# Patient Record
Sex: Male | Born: 1985
Health system: Southern US, Community
[De-identification: ages and names within clinical notes are randomized; demographics above are authoritative.]

## PROBLEM LIST (undated history)

## (undated) DIAGNOSIS — I2699 Other pulmonary embolism without acute cor pulmonale: Secondary | ICD-10-CM

## (undated) DIAGNOSIS — I639 Cerebral infarction, unspecified: Secondary | ICD-10-CM

## (undated) DIAGNOSIS — I8290 Acute embolism and thrombosis of unspecified vein: Secondary | ICD-10-CM

## (undated) DIAGNOSIS — F445 Conversion disorder with seizures or convulsions: Secondary | ICD-10-CM

## (undated) DIAGNOSIS — I10 Essential (primary) hypertension: Secondary | ICD-10-CM

## (undated) DIAGNOSIS — R569 Unspecified convulsions: Secondary | ICD-10-CM

## (undated) DIAGNOSIS — W3400XA Accidental discharge from unspecified firearms or gun, initial encounter: Secondary | ICD-10-CM

## (undated) DIAGNOSIS — Y249XXA Unspecified firearm discharge, undetermined intent, initial encounter: Secondary | ICD-10-CM

## (undated) DIAGNOSIS — J45909 Unspecified asthma, uncomplicated: Secondary | ICD-10-CM

## (undated) HISTORY — PX: OTHER SURGICAL HISTORY: SHX169

## (undated) HISTORY — PX: TOTAL HIP ARTHROPLASTY: SHX124

## (undated) HISTORY — PX: BRAIN SURGERY: SHX531

---

## 2006-08-12 ENCOUNTER — Emergency Department (HOSPITAL_COMMUNITY): Admission: EM | Admit: 2006-08-12 | Discharge: 2006-08-12 | Payer: Self-pay | Admitting: Emergency Medicine

## 2010-11-28 ENCOUNTER — Emergency Department (HOSPITAL_COMMUNITY)
Admission: EM | Admit: 2010-11-28 | Discharge: 2010-11-29 | Payer: Self-pay | Source: Home / Self Care | Admitting: Emergency Medicine

## 2018-06-14 ENCOUNTER — Other Ambulatory Visit
Admission: RE | Admit: 2018-06-14 | Discharge: 2018-06-14 | Disposition: A | Discharge status: Home / Self Care | Payer: Self-pay | Source: Ambulatory Visit | Attending: *Deleted | Admitting: *Deleted

## 2018-06-14 LAB — CBC WITH DIFFERENTIAL/PLATELET
Basophils Absolute: 0 10*3/uL (ref 0–0.1)
Basophils Relative: 0 %
Eosinophils Absolute: 0.2 10*3/uL (ref 0–0.7)
Eosinophils Relative: 3 %
HCT: 45.9 % (ref 40.0–52.0)
Hemoglobin: 15.3 g/dL (ref 13.0–18.0)
Lymphocytes Relative: 38 %
Lymphs Abs: 2.8 10*3/uL (ref 1.0–3.6)
MCH: 28.9 pg (ref 26.0–34.0)
MCHC: 33.4 g/dL (ref 32.0–36.0)
MCV: 86.4 fL (ref 80.0–100.0)
Monocytes Absolute: 0.4 10*3/uL (ref 0.2–1.0)
Monocytes Relative: 6 %
Neutro Abs: 4 10*3/uL (ref 1.4–6.5)
Neutrophils Relative %: 53 %
Platelets: 266 10*3/uL (ref 150–440)
RBC: 5.31 MIL/uL (ref 4.40–5.90)
RDW: 14 % (ref 11.5–14.5)
WBC: 7.6 10*3/uL (ref 3.8–10.6)

## 2018-06-14 LAB — PROTIME-INR
INR: 1.39
Prothrombin Time: 16.9 seconds — ABNORMAL HIGH (ref 11.4–15.2)

## 2018-06-21 ENCOUNTER — Other Ambulatory Visit
Admission: RE | Admit: 2018-06-21 | Discharge: 2018-06-21 | Disposition: A | Discharge status: Home / Self Care | Payer: Self-pay | Source: Ambulatory Visit | Attending: Internal Medicine | Admitting: Internal Medicine

## 2018-06-21 LAB — PROTIME-INR
INR: 2.54
Prothrombin Time: 27.1 seconds — ABNORMAL HIGH (ref 11.4–15.2)

## 2018-06-21 LAB — APTT: aPTT: 45 seconds — ABNORMAL HIGH (ref 24–36)

## 2018-11-17 ENCOUNTER — Encounter (HOSPITAL_COMMUNITY): Payer: Self-pay

## 2018-11-17 ENCOUNTER — Emergency Department (HOSPITAL_COMMUNITY)
Admission: EM | Admit: 2018-11-17 | Discharge: 2018-11-18 | Disposition: A | Discharge status: Home / Self Care | Payer: Self-pay | Attending: Emergency Medicine | Admitting: Emergency Medicine

## 2018-11-17 ENCOUNTER — Emergency Department (HOSPITAL_COMMUNITY): Payer: Self-pay

## 2018-11-17 DIAGNOSIS — T50901A Poisoning by unspecified drugs, medicaments and biological substances, accidental (unintentional), initial encounter: Secondary | ICD-10-CM | POA: Insufficient documentation

## 2018-11-17 DIAGNOSIS — Z7901 Long term (current) use of anticoagulants: Secondary | ICD-10-CM | POA: Insufficient documentation

## 2018-11-17 DIAGNOSIS — Z79899 Other long term (current) drug therapy: Secondary | ICD-10-CM | POA: Insufficient documentation

## 2018-11-17 DIAGNOSIS — R55 Syncope and collapse: Secondary | ICD-10-CM | POA: Insufficient documentation

## 2018-11-17 LAB — COMPREHENSIVE METABOLIC PANEL
ALT: 37 U/L (ref 0–44)
AST: 23 U/L (ref 15–41)
Albumin: 3.8 g/dL (ref 3.5–5.0)
Alkaline Phosphatase: 85 U/L (ref 38–126)
Anion gap: 13 (ref 5–15)
BUN: 13 mg/dL (ref 6–20)
CO2: 22 mmol/L (ref 22–32)
Calcium: 8.4 mg/dL — ABNORMAL LOW (ref 8.9–10.3)
Chloride: 105 mmol/L (ref 98–111)
Creatinine, Ser: 1.17 mg/dL (ref 0.61–1.24)
GFR calc Af Amer: >60 mL/min (ref >60)
GFR calc non Af Amer: >60 mL/min (ref >60)
Glucose, Bld: 168 mg/dL — ABNORMAL HIGH (ref 70–99)
Potassium: 3.5 mmol/L (ref 3.5–5.1)
Sodium: 140 mmol/L (ref 135–145)
Total Bilirubin: 1 mg/dL (ref 0.3–1.2)
Total Protein: 6.4 g/dL — ABNORMAL LOW (ref 6.5–8.1)

## 2018-11-17 LAB — CBC
HCT: 47.6 % (ref 39.0–52.0)
Hemoglobin: 14.8 g/dL (ref 13.0–17.0)
MCH: 27.4 pg (ref 26.0–34.0)
MCHC: 31.1 g/dL (ref 30.0–36.0)
MCV: 88.1 fL (ref 80.0–100.0)
Platelets: 236 10*3/uL (ref 150–400)
RBC: 5.4 MIL/uL (ref 4.22–5.81)
RDW: 13.2 % (ref 11.5–15.5)
WBC: 11.1 10*3/uL — ABNORMAL HIGH (ref 4.0–10.5)
nRBC: 0 % (ref 0.0–0.2)

## 2018-11-17 LAB — RAPID URINE DRUG SCREEN, HOSP PERFORMED
Amphetamines: NOT DETECTED
Barbiturates: NOT DETECTED
Benzodiazepines: NOT DETECTED
Cocaine: POSITIVE — AB
Opiates: NOT DETECTED
Tetrahydrocannabinol: POSITIVE — AB

## 2018-11-17 LAB — PHENYTOIN LEVEL, TOTAL: Phenytoin Lvl: <2.5 ug/mL — ABNORMAL LOW (ref 10.0–20.0)

## 2018-11-17 LAB — CBG MONITORING, ED: Glucose-Capillary: 149 mg/dL — ABNORMAL HIGH (ref 70–99)

## 2018-11-17 LAB — PROTIME-INR
INR: 1.44
Prothrombin Time: 17.3 seconds — ABNORMAL HIGH (ref 11.4–15.2)

## 2018-11-17 LAB — ETHANOL: Alcohol, Ethyl (B): <10 mg/dL (ref <10)

## 2018-11-17 LAB — ACETAMINOPHEN LEVEL: Acetaminophen (Tylenol), Serum: <10 ug/mL — ABNORMAL LOW (ref 10–30)

## 2018-11-17 LAB — SALICYLATE LEVEL: Salicylate Lvl: <7 mg/dL (ref 2.8–30.0)

## 2018-11-17 MED ORDER — SODIUM CHLORIDE 0.9 % IV BOLUS (SEPSIS)
1000.0000 mL | Freq: Once | INTRAVENOUS | Status: AC
  Administered 2018-11-17: 1000 mL via INTRAVENOUS

## 2018-11-17 MED ORDER — SODIUM CHLORIDE 0.9 % IV SOLN
1000.0000 mL | INTRAVENOUS | Status: DC
Start: 2018-11-17 — End: 2018-11-18
  Administered 2018-11-17: 1000 mL via INTRAVENOUS
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
## 2018-11-17 NOTE — ED Notes (Signed)
Bed: RESA Expected date:  Expected time:  Means of arrival:  Comments: Overdose/narcan
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
## 2018-11-17 NOTE — ED Notes (Signed)
Pt states that he doesn't do heroin ans when asked if he was suicidal, pt responds "fuck no,"  Pt's belongings are on the bedside table

## 2018-11-17 NOTE — ED Notes (Signed)
Pt states that he takes dilantin, keppra and topamax for his seizures and he takes coumadin for his blood clots

## 2018-11-17 NOTE — ED Notes (Signed)
[place] [employer] [place] had [place] in his bag

## 2018-11-18 MED ORDER — LEVETIRACETAM 500 MG PO TABS: 1000.0000 mg | ORAL_TABLET | Freq: Two times a day (BID) | ORAL | 0 refills | Status: DC

## 2018-11-18 NOTE — ED Notes (Signed)
Pt verbalized discharge understanding and follow up care. Gait steady. Headed home with family

## 2018-11-18 NOTE — Discharge Instructions (Signed)
1.  Follow-up tomorrow morning with the present to get your medication doses.  You need to make phone calls first thing tomorrow for follow-up. 2.  You are being given a prescription for Keppra 1000 mg twice a day based on your report of your dose.  You must get established with a family doctor and neurologist as soon as possible.

## 2020-06-06 ENCOUNTER — Emergency Department (HOSPITAL_BASED_OUTPATIENT_CLINIC_OR_DEPARTMENT_OTHER)
Admit: 2020-06-06 | Discharge: 2020-06-06 | Disposition: A | Discharge status: Home / Self Care | Payer: Self-pay | Attending: Emergency Medicine | Admitting: Emergency Medicine

## 2020-06-06 ENCOUNTER — Other Ambulatory Visit: Payer: Self-pay

## 2020-06-06 ENCOUNTER — Emergency Department (HOSPITAL_COMMUNITY)
Admission: EM | Admit: 2020-06-06 | Discharge: 2020-06-06 | Disposition: A | Discharge status: Home / Self Care | Payer: Self-pay | Attending: Emergency Medicine | Admitting: Emergency Medicine

## 2020-06-06 ENCOUNTER — Emergency Department (HOSPITAL_COMMUNITY): Payer: Self-pay

## 2020-06-06 ENCOUNTER — Encounter (HOSPITAL_COMMUNITY): Payer: Self-pay

## 2020-06-06 DIAGNOSIS — Z7982 Long term (current) use of aspirin: Secondary | ICD-10-CM | POA: Insufficient documentation

## 2020-06-06 DIAGNOSIS — Z008 Encounter for other general examination: Secondary | ICD-10-CM

## 2020-06-06 DIAGNOSIS — R2243 Localized swelling, mass and lump, lower limb, bilateral: Secondary | ICD-10-CM | POA: Insufficient documentation

## 2020-06-06 DIAGNOSIS — I825Y3 Chronic embolism and thrombosis of unspecified deep veins of proximal lower extremity, bilateral: Secondary | ICD-10-CM

## 2020-06-06 DIAGNOSIS — Z86718 Personal history of other venous thrombosis and embolism: Secondary | ICD-10-CM

## 2020-06-06 HISTORY — DX: Acute embolism and thrombosis of unspecified vein: I82.90

## 2020-06-06 LAB — CBC WITH DIFFERENTIAL/PLATELET
Abs Immature Granulocytes: 0.03 10*3/uL (ref 0.00–0.07)
Basophils Absolute: 0 10*3/uL (ref 0.0–0.1)
Basophils Relative: 1 %
Eosinophils Absolute: 0.1 10*3/uL (ref 0.0–0.5)
Eosinophils Relative: 2 %
HCT: 44 % (ref 39.0–52.0)
Hemoglobin: 13.8 g/dL (ref 13.0–17.0)
Immature Granulocytes: 0 %
Lymphocytes Relative: 19 %
Lymphs Abs: 1.4 10*3/uL (ref 0.7–4.0)
MCH: 27.5 pg (ref 26.0–34.0)
MCHC: 31.4 g/dL (ref 30.0–36.0)
MCV: 87.6 fL (ref 80.0–100.0)
Monocytes Absolute: 0.6 10*3/uL (ref 0.1–1.0)
Monocytes Relative: 8 %
Neutro Abs: 5.2 10*3/uL (ref 1.7–7.7)
Neutrophils Relative %: 70 %
Platelets: 288 10*3/uL (ref 150–400)
RBC: 5.02 MIL/uL (ref 4.22–5.81)
RDW: 13.4 % (ref 11.5–15.5)
WBC: 7.4 10*3/uL (ref 4.0–10.5)
nRBC: 0 % (ref 0.0–0.2)

## 2020-06-06 LAB — PROTIME-INR
INR: 1.1 (ref 0.8–1.2)
Prothrombin Time: 13.8 seconds (ref 11.4–15.2)

## 2020-06-06 LAB — BASIC METABOLIC PANEL
Anion gap: 9 (ref 5–15)
BUN: 12 mg/dL (ref 6–20)
CO2: 26 mmol/L (ref 22–32)
Calcium: 9.1 mg/dL (ref 8.9–10.3)
Chloride: 105 mmol/L (ref 98–111)
Creatinine, Ser: 1.16 mg/dL (ref 0.61–1.24)
GFR calc Af Amer: >60 mL/min (ref >60)
GFR calc non Af Amer: >60 mL/min (ref >60)
Glucose, Bld: 100 mg/dL — ABNORMAL HIGH (ref 70–99)
Potassium: 4.2 mmol/L (ref 3.5–5.1)
Sodium: 140 mmol/L (ref 135–145)

## 2020-06-06 LAB — D-DIMER, QUANTITATIVE: D-Dimer, Quant: 1.16 ug/mL-FEU — ABNORMAL HIGH (ref 0.00–0.50)

## 2020-06-06 MED ORDER — RIVAROXABAN (XARELTO) VTE STARTER PACK (15 & 20 MG): ORAL_TABLET | ORAL | 0 refills | Status: DC

## 2020-06-06 MED ORDER — RIVAROXABAN 15 MG PO TABS
15.0000 mg | ORAL_TABLET | Freq: Once | ORAL | Status: AC
  Administered 2020-06-06: 15 mg via ORAL
  Filled 2020-06-06: qty 1

## 2020-06-06 MED ORDER — SODIUM CHLORIDE (PF) 0.9 % IJ SOLN
INTRAMUSCULAR | Status: AC
  Filled 2020-06-06: qty 50

## 2020-06-06 MED ORDER — RIVAROXABAN (XARELTO) EDUCATION KIT FOR DVT/PE PATIENTS
PACK | Freq: Once | Status: AC
  Filled 2020-06-06: qty 1

## 2020-06-06 MED ORDER — IOHEXOL 300 MG/ML  SOLN
100.0000 mL | Freq: Once | INTRAMUSCULAR | Status: AC | PRN
  Administered 2020-06-06: 100 mL via INTRAVENOUS

## 2020-06-06 NOTE — ED Provider Notes (Signed)
[facility]-EMERGENCY DEPT Provider Note   CSN: [id number] Arrival date & time: 06/06/20  0047     History Chief Complaint  Patient presents with  . Leg Swelling    John Flynn is a 34 y.o. male.  Patient to ED with [employer]. Per [employer], during arrest the patient reported he was having pain and swelling in bilateral LE's and SOB, and states he has a history of multiple blood clots. 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 He reports he was on Coumadin initially but when seen at [facility] last year it was changed to Xarelto, then back to Coumadin when incarcerated subsequently. He states he hasn't wanted to take Coumadin. He reports his last seizure was 3 days ago.   The history is provided by the patient. No language interpreter was used.       Past Medical History:  Diagnosis Date  . Blood clot in vein     There are no problems to display for this patient.   History reviewed. No pertinent surgical history.     No family history on file.  Social History   Tobacco Use  . Smoking status: Never Smoker  . Smokeless tobacco: Never Used  Substance Use Topics  . Alcohol use: Yes  . Drug use: Yes    Home Medications Prior to Admission medications   Medication Sig Start Date End Date Taking? Authorizing Provider  levETIRAcetam (KEPPRA PO) Take by mouth.    [provider]  levETIRAcetam (KEPPRA) 500 MG tablet Take 2 tablets (1,000 mg total) by mouth 2 (two) times daily. 11/18/18   Arby Barrette, MD  PHENYTOIN PO Take by mouth 3 (three) times daily.     [provider]  Topiramate (TOPAMAX PO) Take by mouth.    [provider]  Warfarin Sodium (COUMADIN PO) Take by mouth.    [provider]    Allergies    Aspirin and Nsaids  Review of Systems   Review of Systems  Constitutional: Negative for chills and fever.  HENT: Negative.    Respiratory: Positive for shortness of breath. Negative for cough.   Cardiovascular: Positive for leg swelling.  Gastrointestinal: Negative.   Musculoskeletal: Negative.   Skin: Negative.   Neurological: Negative.  Negative for seizures and weakness.    Physical Exam Updated Vital Signs BP (!) 139/102 (BP Location: Left Arm)   Pulse 83   Temp 98.6 F (37 C) (Oral)   Resp 18   Ht 5\' 9"  (1.753 m)   Wt 79.4 kg   SpO2 100%   BMI 25.84 kg/m   Physical Exam Vitals and nursing note reviewed.  Constitutional:      Appearance: He is well-developed.  HENT:     Head: Normocephalic.  Cardiovascular:     Rate and Rhythm: Normal rate and regular rhythm.  Pulmonary:     Comments: Poor effort. Patient refuses to breath for exam stating it causes pain. No dyspnea with speaking, able to speak full sentences and move around without visualized respiratory difficulty. Abdominal:     General: Bowel sounds are normal.     Palpations: Abdomen is soft.     Tenderness: There is no abdominal tenderness. There is no guarding or rebound.  Musculoskeletal:        General: Normal range of motion.     Cervical back: Normal range of motion and neck supple.  Comments: There is mild bilateral LE edema mostly to the ankles. No pretibial pitting. No discoloration. Pulses present distally.   Skin:    General: Skin is warm and dry.     Findings: No rash.  Neurological:     Mental Status: He is alert.     ED Results / Procedures / Treatments   Labs (all labs ordered are listed, but only abnormal results are displayed) Labs Reviewed  D-DIMER, QUANTITATIVE (NOT AT [facility])  CBC WITH DIFFERENTIAL/PLATELET  BASIC METABOLIC PANEL  PROTIME-INR   Results for orders placed or performed during the hospital encounter of 06/06/20  D-dimer, quantitative (not at [facility])  Result Value Ref Range   D-Dimer, Quant 1.16 (H) 0.00 - 0.50 ug/mL-FEU  CBC with Differential  Result Value Ref Range   WBC 7.4 4.0 - 10.5  K/uL   RBC 5.02 4.22 - 5.81 MIL/uL   Hemoglobin 13.8 13.0 - 17.0 g/dL   HCT 75.6 39 - 52 %   MCV 87.6 80.0 - 100.0 fL   MCH 27.5 26.0 - 34.0 pg   MCHC 31.4 30.0 - 36.0 g/dL   RDW 43.3 29.5 - 18.8 %   Platelets 288 150 - 400 K/uL   nRBC 0.0 0.0 - 0.2 %   Neutrophils Relative % 70 %   Neutro Abs 5.2 1.7 - 7.7 K/uL   Lymphocytes Relative 19 %   Lymphs Abs 1.4 0.7 - 4.0 K/uL   Monocytes Relative 8 %   Monocytes Absolute 0.6 0 - 1 K/uL   Eosinophils Relative 2 %   Eosinophils Absolute 0.1 0 - 0 K/uL   Basophils Relative 1 %   Basophils Absolute 0.0 0 - 0 K/uL   Immature Granulocytes 0 %   Abs Immature Granulocytes 0.03 0.00 - 0.07 K/uL  Basic metabolic panel  Result Value Ref Range   Sodium 140 135 - 145 mmol/L   Potassium 4.2 3.5 - 5.1 mmol/L   Chloride 105 98 - 111 mmol/L   CO2 26 22 - 32 mmol/L   Glucose, Bld 100 (H) 70 - 99 mg/dL   BUN 12 6 - 20 mg/dL   Creatinine, Ser 4.16 0.61 - 1.24 mg/dL   Calcium 9.1 8.9 - 60.6 mg/dL   GFR calc non Af Amer >60 >60 mL/min   GFR calc Af Amer >60 >60 mL/min   Anion gap 9 5 - 15  Protime-INR  Result Value Ref Range   Prothrombin Time 13.8 11.4 - 15.2 seconds   INR 1.1 0.8 - 1.2    EKG None  Radiology No results found. CT Angio Chest PE W and/or Wo Contrast  Result Date: 06/06/2020 CLINICAL DATA:  PE suspected, lower extremity DVT EXAM: CT ANGIOGRAPHY CHEST WITH CONTRAST TECHNIQUE: Multidetector CT imaging of the chest was performed using the standard protocol during bolus administration of intravenous contrast. Multiplanar CT image reconstructions and MIPs were obtained to evaluate the vascular anatomy. CONTRAST:  OMNIPAQUE IOHEXOL 300 MG/ML  SOLN COMPARISON:  Multiple priors, most recently 08/19/2019 FINDINGS: Cardiovascular: Borderline opacification of the pulmonary arteries. No large central filling defects. More distal lobar and segmental pulmonary emboli are difficult to fully exclude particularly in the lower lungs given  extensive respiratory motion. Central pulmonary arteries are normal caliber. The aorta is normal caliber. No visible acute luminal abnormality nor periaortic stranding or hemorrhage. Normal 3 vessel branching of the aortic arch. Small amount of reflux of contrast into the IVC and hepatic veins, similar to priors. Mediastinum/Nodes: No mediastinal  fluid or gas. Normal thyroid gland and thoracic inlet. No acute abnormality of the trachea or esophagus. No worrisome mediastinal, hilar or axillary adenopathy. Lungs/Pleura: Extensive respiratory motion artifact limits evaluation of the lung parenchyma. No visible consolidative opacity, pneumothorax or effusion. No features of edema. No discernible nodules or masses. Upper Abdomen: No acute abnormalities present in the visualized portions of the upper abdomen. Musculoskeletal: Multilevel degenerative changes are present in the imaged portions of the spine. No acute osseous abnormality or suspicious osseous lesion. Paucity of subcutaneous fat. No worrisome chest wall lesions. Review of the MIP images confirms the above findings. IMPRESSION: 1. No large central filling defects. More distal lobar and segmental pulmonary emboli are difficult to fully exclude particularly in the lower lungs given extensive respiratory motion artifact and borderline opacification. 2. No acute intrathoracic process identified. Electronically Signed   By: Kreg Shropshire M.D.   On: 06/06/2020 03:49   DG Chest Portable 1 View  Result Date: 06/06/2020 CLINICAL DATA:  Shortness of breath EXAM: PORTABLE CHEST 1 VIEW COMPARISON:  07/31/2019 FINDINGS: The heart size and mediastinal contours are within normal limits. Both lungs are clear. The visualized skeletal structures are unremarkable. IMPRESSION: No active disease. Electronically Signed   By: Alcide Clever M.D.   On: 06/06/2020 02:13    Procedures Procedures (including critical care time)  Medications Ordered in ED Medications - No data to  display  ED Course  I have reviewed the triage vital signs and the nursing notes.  Pertinent labs & imaging results that were available during my care of the patient were reviewed by me and considered in my medical decision making (see chart for details).    MDM Rules/Calculators/A&P                          Patient to ED with [employer] for complaints as described in the HPI.   The patient appears stable, in NAD. No tachycardia or hypoxia to suggest acute PE. The patient is felt to be an unreliable historian secondary to complaint of symptoms at the time of arrest, vagueness of details and his attributing this to "I don't remember." He also reports getting medical care including care of his DVT/PE at [facility] regional but there is no record of his being seen at that facility or at [facility] campuses in [place]. Registration was asked to verify the information in his chart. He does list Coumadin on his medications list but he states he is not taking this. INR negative to corroborate this.   D-dimer was done and is elevated to 1.19. CTA PE study shows no major vessel emboli but is a poor study due to motion artifact. Dopplers are felt to be necessary to verify patient medical status. He has been given a Xarelto in the ED and, if/when discharged, will need to continue this due to his stating he has "multiple clots" in the past.   Patient care signed out to [facility], PA-C, pending doppler studies and potential discharge into custody.   Final Clinical Impression(s) / ED Diagnoses Final diagnoses:  None   1. Mild LE edema  Rx / DC Orders ED Discharge Orders    None       Elpidio Anis, PA-C 06/06/20 0719    Ward, Layla Maw, DO 06/06/20 [phone]

## 2020-06-06 NOTE — ED Triage Notes (Signed)
Pt sts bilateral upper and lower extremity swelling. 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 Came in with [employer] for medical clearance prior to going to jail.

## 2020-06-06 NOTE — ED Provider Notes (Signed)
I assumed care of patient from John Ripple PA-C at shift change, please see her note for full H&P   Physical Exam  BP 126/86 (BP Location: Left Arm)   Pulse 85   Temp 98.6 F (37 C) (Oral)   Resp 16   Ht 5\' 9"  (1.753 m)   Wt 79.4 kg   SpO2 100%   BMI 25.84 kg/m   Physical Exam Vitals and nursing note reviewed.  Constitutional:      General: He is not in acute distress. Cardiovascular:     Rate and Rhythm: Normal rate.  Neurological:     Mental Status: He is alert.     ED Course/Procedures     Procedures  MDM   CT Angio Chest PE W and/or Wo Contrast  Result Date: 06/06/2020 CLINICAL DATA:  PE suspected, lower extremity DVT EXAM: CT ANGIOGRAPHY CHEST WITH CONTRAST TECHNIQUE: Multidetector CT imaging of the chest was performed using the standard protocol during bolus administration of intravenous contrast. Multiplanar CT image reconstructions and MIPs were obtained to evaluate the vascular anatomy. CONTRAST:  08/07/2020 OMNIPAQUE IOHEXOL 300 MG/ML  SOLN COMPARISON:  Multiple priors, most recently 08/19/2019 FINDINGS: Cardiovascular: Borderline opacification of the pulmonary arteries. No large central filling defects. More distal lobar and segmental pulmonary emboli are difficult to fully exclude particularly in the lower lungs given extensive respiratory motion. Central pulmonary arteries are normal caliber. The aorta is normal caliber. No visible acute luminal abnormality nor periaortic stranding or hemorrhage. Normal 3 vessel branching of the aortic arch. Small amount of reflux of contrast into the IVC and hepatic veins, similar to priors. Mediastinum/Nodes: No mediastinal fluid or gas. Normal thyroid gland and thoracic inlet. No acute abnormality of the trachea or esophagus. No worrisome mediastinal, hilar or axillary adenopathy. Lungs/Pleura: Extensive respiratory motion artifact limits evaluation of the lung parenchyma. No visible consolidative opacity, pneumothorax or effusion. No  features of edema. No discernible nodules or masses. Upper Abdomen: No acute abnormalities present in the visualized portions of the upper abdomen. Musculoskeletal: Multilevel degenerative changes are present in the imaged portions of the spine. No acute osseous abnormality or suspicious osseous lesion. Paucity of subcutaneous fat. No worrisome chest wall lesions. Review of the MIP images confirms the above findings. IMPRESSION: 1. No large central filling defects. More distal lobar and segmental pulmonary emboli are difficult to fully exclude particularly in the lower lungs given extensive respiratory motion artifact and borderline opacification. 2. No acute intrathoracic process identified. Electronically Signed   By: 08/21/2019 M.D.   On: 06/06/2020 03:49   DG Chest Portable 1 View  Result Date: 06/06/2020 CLINICAL DATA:  Shortness of breath EXAM: PORTABLE CHEST 1 VIEW COMPARISON:  07/31/2019 FINDINGS: The heart size and mediastinal contours are within normal limits. Both lungs are clear. The visualized skeletal structures are unremarkable. IMPRESSION: No active disease. Electronically Signed   By: 08/02/2019 M.D.   On: 06/06/2020 02:13   VAS 08/07/2020 LOWER EXTREMITY VENOUS (DVT) (ONLY [facility] & [facility])  Result Date: 06/06/2020  Lower Venous DVTStudy Indications: Remote history of DVT.  Comparison Study: No prior study Performing Technologist: 08/07/2020 MHA, RDMS, RVT, RDCS  Examination Guidelines: A complete evaluation includes B-mode imaging, spectral Doppler, color Doppler, and power Doppler as needed of all accessible portions of each vessel. Bilateral testing is considered an integral part of a complete examination. Limited examinations for reoccurring indications may be performed as noted. The reflux portion of the exam is performed with the patient in reverse  Trendelenburg.  +---------+---------------+---------+-----------+----------+-----------------+ RIGHT     CompressibilityPhasicitySpontaneityPropertiesThrombus Aging    +---------+---------------+---------+-----------+----------+-----------------+ CFV      Partial        No       Yes                  Chronic           +---------+---------------+---------+-----------+----------+-----------------+ SFJ      Partial                                      Chronic           +---------+---------------+---------+-----------+----------+-----------------+ FV Prox  Partial                                      Chronic           +---------+---------------+---------+-----------+----------+-----------------+ FV Mid   Partial                                      Chronic           +---------+---------------+---------+-----------+----------+-----------------+ FV DistalPartial                                      Chronic           +---------+---------------+---------+-----------+----------+-----------------+ PFV      Partial                                      Chronic           +---------+---------------+---------+-----------+----------+-----------------+ POP      Partial        Yes      Yes                  Age Indeterminate +---------+---------------+---------+-----------+----------+-----------------+ PTV      Full                    Yes                                    +---------+---------------+---------+-----------+----------+-----------------+ PERO     Full                    Yes                                    +---------+---------------+---------+-----------+----------+-----------------+   +---------+---------------+---------+-----------+----------+--------------+ LEFT     CompressibilityPhasicitySpontaneityPropertiesThrombus Aging +---------+---------------+---------+-----------+----------+--------------+ CFV      Full           No       Yes                                  +---------+---------------+---------+-----------+----------+--------------+ SFJ      Full  Chronic        +---------+---------------+---------+-----------+----------+--------------+ FV Prox  Full                                                        +---------+---------------+---------+-----------+----------+--------------+ FV Mid   Full                                                        +---------+---------------+---------+-----------+----------+--------------+ FV DistalFull                                                        +---------+---------------+---------+-----------+----------+--------------+ PFV      Full                                                        +---------+---------------+---------+-----------+----------+--------------+ POP      Partial        Yes      Yes                  Chronic        +---------+---------------+---------+-----------+----------+--------------+ PTV      Full                    Yes                                 +---------+---------------+---------+-----------+----------+--------------+ PERO     Full                    Yes                                 +---------+---------------+---------+-----------+----------+--------------+     Summary: RIGHT: - Findings consistent with age indeterminate deep vein thrombosis involving the right popliteal vein. - Findings consistent with chronic deep vein thrombosis involving the right common femoral vein, SF junction, right femoral vein, and right proximal profunda vein. - No cystic structure found in the popliteal fossa.  LEFT: - Findings consistent with chronic deep vein thrombosis involving the SF junction, and left popliteal vein. - Ultrasound characteristics of enlarged lymph nodes noted in the groin.  *See table(s) above for measurements and observations. Electronically signed by Waverly Ferrarihristopher Dickson MD on 06/06/2020 at 10:25:54 AM.     Final      0845: tech reports diffuse chronic dvt.   Plan is to follow-up on DVT study. Ultrasound shows diffuse chronic DVTs without acute DVT.  His PE study was negative, while there were technical limitations he is not hypoxic, tachycardic or tachypneic.  As he has not been on Xarelto before and had previously been managed on warfarin pharmacy tech came to educate patient.  He is given a prescription for Xarelto.  He is able to eat in the emergency room without difficulty or distress.  He is discharged with the police.  Return precautions were discussed with patient who states their understanding.  At the time of discharge patient denied any unaddressed complaints or concerns.  Patient is agreeable for discharge.  Note: Portions of this report may have been transcribed using voice recognition software. Every effort was made to ensure accuracy; however, inadvertent computerized transcription errors may be present      Cristina Gong, PA-C 06/06/20 1432    Ward, Layla Maw, DO 06/07/20 0011

## 2020-06-06 NOTE — Discharge Instructions (Addendum)
Please take Tylenol (acetaminophen) to relieve your pain.  You may take tylenol, up to 1,000 mg (two extra strength pills).  Do not take more than 3,000 mg tylenol in a 24 hour period.  Please check all medication labels as many medications such as pain and cold medications may contain tylenol. Please do not drink alcohol while taking this medication.  ° °

## 2020-06-06 NOTE — ED Notes (Signed)
Pharmacy called regarding Xarelto education kit, they will be up to educate.

## 2020-06-06 NOTE — Progress Notes (Signed)
Bilateral lower extremity venous duplex completed. Refer to "CV Proc" under chart review to view preliminary results.  Preliminary results discussed with Dr. Elesa Massed.  06/06/2020 9:07 AM Eula Fried., MHA, RVT, RDCS, RDMS

## 2020-08-08 ENCOUNTER — Emergency Department (HOSPITAL_COMMUNITY)
Admission: EM | Admit: 2020-08-08 | Discharge: 2020-08-08 | Disposition: A | Discharge status: Home / Self Care | Attending: Emergency Medicine | Admitting: Emergency Medicine

## 2020-08-08 ENCOUNTER — Emergency Department (HOSPITAL_COMMUNITY)

## 2020-08-08 ENCOUNTER — Encounter (HOSPITAL_COMMUNITY): Payer: Self-pay | Admitting: Obstetrics and Gynecology

## 2020-08-08 ENCOUNTER — Other Ambulatory Visit: Payer: Self-pay

## 2020-08-08 DIAGNOSIS — G40909 Epilepsy, unspecified, not intractable, without status epilepticus: Secondary | ICD-10-CM | POA: Insufficient documentation

## 2020-08-08 DIAGNOSIS — Z86718 Personal history of other venous thrombosis and embolism: Secondary | ICD-10-CM | POA: Diagnosis not present

## 2020-08-08 DIAGNOSIS — Z9114 Patient's other noncompliance with medication regimen: Secondary | ICD-10-CM | POA: Insufficient documentation

## 2020-08-08 DIAGNOSIS — Z79899 Other long term (current) drug therapy: Secondary | ICD-10-CM | POA: Diagnosis not present

## 2020-08-08 DIAGNOSIS — Z7901 Long term (current) use of anticoagulants: Secondary | ICD-10-CM | POA: Insufficient documentation

## 2020-08-08 LAB — CBC
HCT: 41.7 % (ref 39.0–52.0)
Hemoglobin: 13 g/dL (ref 13.0–17.0)
MCH: 28 pg (ref 26.0–34.0)
MCHC: 31.2 g/dL (ref 30.0–36.0)
MCV: 89.7 fL (ref 80.0–100.0)
Platelets: 200 10*3/uL (ref 150–400)
RBC: 4.65 MIL/uL (ref 4.22–5.81)
RDW: 14 % (ref 11.5–15.5)
WBC: 5.2 10*3/uL (ref 4.0–10.5)
nRBC: 0 % (ref 0.0–0.2)

## 2020-08-08 LAB — BASIC METABOLIC PANEL
Anion gap: 10 (ref 5–15)
BUN: 9 mg/dL (ref 6–20)
CO2: 24 mmol/L (ref 22–32)
Calcium: 7.8 mg/dL — ABNORMAL LOW (ref 8.9–10.3)
Chloride: 107 mmol/L (ref 98–111)
Creatinine, Ser: 0.89 mg/dL (ref 0.61–1.24)
GFR calc Af Amer: >60 mL/min (ref >60)
GFR calc non Af Amer: >60 mL/min (ref >60)
Glucose, Bld: 106 mg/dL — ABNORMAL HIGH (ref 70–99)
Potassium: 3.6 mmol/L (ref 3.5–5.1)
Sodium: 141 mmol/L (ref 135–145)

## 2020-08-08 MED ORDER — PHENYTOIN SODIUM EXTENDED 100 MG PO CAPS: 100.0000 mg | ORAL_CAPSULE | Freq: Three times a day (TID) | ORAL | 0 refills | Status: DC

## 2020-08-08 MED ORDER — LEVETIRACETAM IN NACL 1000 MG/100ML IV SOLN
1000.0000 mg | Freq: Once | INTRAVENOUS | Status: AC
  Administered 2020-08-08: 1000 mg via INTRAVENOUS
  Filled 2020-08-08: qty 100

## 2020-08-08 MED ORDER — PHENYTOIN SODIUM EXTENDED 100 MG PO CAPS
400.0000 mg | ORAL_CAPSULE | Freq: Once | ORAL | Status: AC
  Administered 2020-08-08: 400 mg via ORAL
  Filled 2020-08-08: qty 4

## 2020-08-08 MED ORDER — LEVETIRACETAM 500 MG PO TABS: 1000.0000 mg | ORAL_TABLET | Freq: Two times a day (BID) | ORAL | 0 refills | Status: DC
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
## 2020-08-08 NOTE — Discharge Instructions (Signed)
Take the medications as prescribed for your seizures.  Follow-up with the neurologist when you are able

## 2020-08-08 NOTE — ED Triage Notes (Signed)
Patient reports to the ER from jail for a seizure. Patient was on the floor drooling when they got there and had another seizure with EMS. Patient was given 5mg  of versed by EMS and is now alert and oriented but reports blurred vision.

## 2020-08-16 ENCOUNTER — Emergency Department (HOSPITAL_COMMUNITY)
Admission: EM | Admit: 2020-08-16 | Discharge: 2020-08-16 | Disposition: A | Discharge status: Home / Self Care | Attending: Emergency Medicine | Admitting: Emergency Medicine

## 2020-08-16 ENCOUNTER — Other Ambulatory Visit: Payer: Self-pay

## 2020-08-16 ENCOUNTER — Emergency Department (HOSPITAL_COMMUNITY)

## 2020-08-16 ENCOUNTER — Encounter (HOSPITAL_COMMUNITY): Payer: Self-pay | Admitting: *Deleted

## 2020-08-16 DIAGNOSIS — Z20822 Contact with and (suspected) exposure to covid-19: Secondary | ICD-10-CM | POA: Insufficient documentation

## 2020-08-16 DIAGNOSIS — Z79899 Other long term (current) drug therapy: Secondary | ICD-10-CM | POA: Insufficient documentation

## 2020-08-16 DIAGNOSIS — R569 Unspecified convulsions: Secondary | ICD-10-CM | POA: Insufficient documentation

## 2020-08-16 HISTORY — DX: Unspecified convulsions: R56.9

## 2020-08-16 LAB — CBC
HCT: 42.6 % (ref 39.0–52.0)
Hemoglobin: 13.5 g/dL (ref 13.0–17.0)
MCH: 27.9 pg (ref 26.0–34.0)
MCHC: 31.7 g/dL (ref 30.0–36.0)
MCV: 88 fL (ref 80.0–100.0)
Platelets: 283 10*3/uL (ref 150–400)
RBC: 4.84 MIL/uL (ref 4.22–5.81)
RDW: 13.9 % (ref 11.5–15.5)
WBC: 5.9 10*3/uL (ref 4.0–10.5)
nRBC: 0 % (ref 0.0–0.2)

## 2020-08-16 LAB — BASIC METABOLIC PANEL
Anion gap: 10 (ref 5–15)
BUN: 12 mg/dL (ref 6–20)
CO2: 26 mmol/L (ref 22–32)
Calcium: 9.1 mg/dL (ref 8.9–10.3)
Chloride: 104 mmol/L (ref 98–111)
Creatinine, Ser: 0.92 mg/dL (ref 0.61–1.24)
GFR calc Af Amer: >60 mL/min (ref >60)
GFR calc non Af Amer: >60 mL/min (ref >60)
Glucose, Bld: 89 mg/dL (ref 70–99)
Potassium: 4 mmol/L (ref 3.5–5.1)
Sodium: 140 mmol/L (ref 135–145)

## 2020-08-16 LAB — CBG MONITORING, ED: Glucose-Capillary: 98 mg/dL (ref 70–99)

## 2020-08-16 LAB — SARS CORONAVIRUS 2 BY RT PCR (HOSPITAL ORDER, PERFORMED IN ~~LOC~~ HOSPITAL LAB): SARS Coronavirus 2: NEGATIVE

## 2020-08-16 NOTE — ED Notes (Signed)
Patient transported to CT 

## 2020-08-16 NOTE — ED Triage Notes (Signed)
Pt had one unwitnessed and one witnessed seizure PTA.  Pt arrives by ems from [employer] jail accompanied by Technical sales engineer.  Pt has hx of seizure and per officer he has been taking all meds.  Pt states that he had a coughing attack and felt that he could not breathe. Pt was found in cell down with inhaler. Pt had another seizure with EMS and was given versed pta.  Pt arrives and is alert and oriented. pt states that he had a cell mate that had covid.  Pt states that he has lost his sense of taste and smell. Pt is unvaccinated. Pt also notes that he has been coughing up blood for the past month.

## 2020-08-16 NOTE — ED Notes (Addendum)
CBG- 99 

## 2020-08-16 NOTE — ED Notes (Signed)
Pt given sandwich, soda, and applesauce

## 2020-08-16 NOTE — ED Provider Notes (Signed)
[facility]-EMERGENCY DEPT Provider Note   CSN: [id number] Arrival date & time: 08/16/20  0004     History Chief Complaint  Patient presents with  . Seizures    John Flynn is a 34 y.o. male.  Patient presents to the emergency department with a chief complaint of seizure.  He presents in custody.  He reports having 1 unwitnessed and one witnessed seizure.  He has history of seizures.  He has been compliant with his medications.  Patient also reports recent exposure to Covid, and recently had a coughing attack and he felt like he could not breathe.  Patient was given Versed by EMS.  Patient is unvaccinated.  He has had some productive cough with blood-tinged sputum.   The history is provided by the patient. No language interpreter was used.       Past Medical History:  Diagnosis Date  . Blood clot in vein   . Seizures (HCC)    on keppra    There are no problems to display for this patient.   History reviewed. No pertinent surgical history.     No family history on file.  Social History   Tobacco Use  . Smoking status: Never Smoker  . Smokeless tobacco: Never Used  Substance Use Topics  . Alcohol use: Yes  . Drug use: Yes    Home Medications Prior to Admission medications   Medication Sig Start Date End Date Taking? Authorizing Provider  levETIRAcetam (KEPPRA) 500 MG tablet Take 2 tablets (1,000 mg total) by mouth 2 (two) times daily. 08/08/20   Linwood Dibbles, MD  phenytoin (DILANTIN) 100 MG ER capsule Take 1 capsule (100 mg total) by mouth 3 (three) times daily. 08/08/20   Linwood Dibbles, MD  RIVAROXABAN Carlena Hurl) VTE STARTER PACK (15 & 20 MG TABLETS) Follow package directions: Take one 15mg  tablet by mouth twice a day. On day 22, switch to one 20mg  tablet once a day. Take with food. 06/06/20   , PA-C    Allergies    Aspirin and Nsaids  Review of Systems   Review of Systems  Neurological: Positive for seizures.  All other  systems reviewed and are negative.   Physical Exam Updated Vital Signs BP (!) 165/97   Pulse 83   Temp 98.8 F (37.1 C) (Oral)   Resp 13   Wt 81.6 kg   SpO2 100%   BMI 26.58 kg/m   Physical Exam Vitals and nursing note reviewed.  Constitutional:      Appearance: He is well-developed.  HENT:     Head: Normocephalic and atraumatic.  Eyes:     Conjunctiva/sclera: Conjunctivae normal.  Cardiovascular:     Rate and Rhythm: Normal rate and regular rhythm.     Heart sounds: No murmur heard.   Pulmonary:     Effort: Pulmonary effort is normal. No respiratory distress.     Breath sounds: Normal breath sounds.  Abdominal:     Palpations: Abdomen is soft.     Tenderness: There is no abdominal tenderness.  Musculoskeletal:     Cervical back: Neck supple.  Skin:    General: Skin is warm and dry.  Neurological:     Mental Status: He is alert.     ED Results / Procedures / Treatments   Labs (all labs ordered are listed, but only abnormal results are displayed) Labs Reviewed  SARS CORONAVIRUS 2 BY RT PCR (HOSPITAL ORDER, PERFORMED IN [facility])  CBC  BASIC  METABOLIC PANEL  CBG MONITORING, ED    EKG None  Radiology CT Head Wo Contrast  Result Date: 08/16/2020 CLINICAL DATA:  Seizure. EXAM: CT HEAD WITHOUT CONTRAST TECHNIQUE: Contiguous axial images were obtained from the base of the skull through the vertex without intravenous contrast. COMPARISON:  August 08, 2020 FINDINGS: Brain: No evidence of acute infarction, hemorrhage, hydrocephalus, extra-axial collection or mass lesion/mass effect. Vascular: No hyperdense vessel or unexpected calcification. Skull: Normal. Negative for fracture or focal lesion. Sinuses/Orbits: No acute finding. Other: None. IMPRESSION: No acute intracranial pathology. Electronically Signed   By: Aram Candela M.D.   On: 08/16/2020 01:43   CT Cervical Spine Wo Contrast  Result Date: 08/16/2020 CLINICAL DATA:  Seizure. EXAM: CT  CERVICAL SPINE WITHOUT CONTRAST TECHNIQUE: Multidetector CT imaging of the cervical spine was performed without intravenous contrast. Multiplanar CT image reconstructions were also generated. COMPARISON:  None. FINDINGS: Alignment: Normal. Skull base and vertebrae: No acute fracture. No primary bone lesion or focal pathologic process. Soft tissues and spinal canal: No prevertebral fluid or swelling. No visible canal hematoma. Disc levels: Mild to moderate severity endplate sclerosis is seen at the levels of C5-C6 and C6-C7, with very mild endplate sclerosis noted at the level of C4-C5. Mild intervertebral disc space narrowing is seen at the levels of C4-C5 and C5-C6. Moderate severity intervertebral disc space narrowing is seen at the level of C6-C7. Normal bilateral multilevel facet joints are noted. Upper chest: Negative. Other: None. IMPRESSION: 1. No acute osseous abnormality. 2. Mild-to-moderate severity degenerative changes at the levels of C4-C5, C5-C6 and C6-C7. Electronically Signed   By: Aram Candela M.D.   On: 08/16/2020 01:46   DG Chest Portable 1 View  Result Date: 08/16/2020 CLINICAL DATA:  Coughing up blood short of breath EXAM: PORTABLE CHEST 1 VIEW COMPARISON:  06/06/2020 FINDINGS: The heart size and mediastinal contours are within normal limits. Both lungs are clear. The visualized skeletal structures are unremarkable. IMPRESSION: No active disease. Electronically Signed   By: Jasmine Pang M.D.   On: 08/16/2020 00:46    Procedures Procedures (including critical care time)  Medications Ordered in ED Medications - No data to display  ED Course  I have reviewed the triage vital signs and the nursing notes.  Pertinent labs & imaging results that were available during my care of the patient were reviewed by me and considered in my medical decision making (see chart for details).    MDM Rules/Calculators/A&P                          Patient here with seizures.  He may have hit  his head.  Will check head CT.  Head CT and cervical spine negative for acute injury.  Laboratory work-up is reassuring.  No significant abnormality noted.  Covid test is negative.  Patient is compliant with his medications.  I have urged him to remain compliant.  He states that he is about to get out of jail, and he will follow up with a neurologist.  He is eating, drinking, and has no further complaints tonight.  Vital signs are stable.  We will plan for discharge with outpatient follow-up. Final Clinical Impression(s) / ED Diagnoses Final diagnoses:  Seizure [facility])    Rx / DC Orders ED Discharge Orders    None       Roxy Horseman, PA-C 08/16/20 0436    Shon Baton, MD 08/16/20 [phone]

## 2020-08-30 ENCOUNTER — Other Ambulatory Visit: Payer: Self-pay

## 2020-08-30 ENCOUNTER — Emergency Department (HOSPITAL_COMMUNITY)

## 2020-08-30 ENCOUNTER — Emergency Department (HOSPITAL_COMMUNITY)
Admission: EM | Admit: 2020-08-30 | Discharge: 2020-08-30 | Disposition: A | Discharge status: Home / Self Care | Attending: Emergency Medicine | Admitting: Emergency Medicine

## 2020-08-30 DIAGNOSIS — W228XXA Striking against or struck by other objects, initial encounter: Secondary | ICD-10-CM | POA: Diagnosis not present

## 2020-08-30 DIAGNOSIS — G40909 Epilepsy, unspecified, not intractable, without status epilepticus: Secondary | ICD-10-CM | POA: Diagnosis not present

## 2020-08-30 DIAGNOSIS — R519 Headache, unspecified: Secondary | ICD-10-CM | POA: Diagnosis not present

## 2020-08-30 DIAGNOSIS — R569 Unspecified convulsions: Secondary | ICD-10-CM | POA: Diagnosis present

## 2020-08-30 LAB — CBC WITH DIFFERENTIAL/PLATELET
Abs Immature Granulocytes: 0.06 10*3/uL (ref 0.00–0.07)
Basophils Absolute: 0 10*3/uL (ref 0.0–0.1)
Basophils Relative: 1 %
Eosinophils Absolute: 0.1 10*3/uL (ref 0.0–0.5)
Eosinophils Relative: 2 %
HCT: 44.1 % (ref 39.0–52.0)
Hemoglobin: 13.9 g/dL (ref 13.0–17.0)
Immature Granulocytes: 1 %
Lymphocytes Relative: 25 %
Lymphs Abs: 1.7 10*3/uL (ref 0.7–4.0)
MCH: 27.1 pg (ref 26.0–34.0)
MCHC: 31.5 g/dL (ref 30.0–36.0)
MCV: 86 fL (ref 80.0–100.0)
Monocytes Absolute: 0.5 10*3/uL (ref 0.1–1.0)
Monocytes Relative: 7 %
Neutro Abs: 4.4 10*3/uL (ref 1.7–7.7)
Neutrophils Relative %: 64 %
Platelets: 312 10*3/uL (ref 150–400)
RBC: 5.13 MIL/uL (ref 4.22–5.81)
RDW: 13.5 % (ref 11.5–15.5)
WBC: 6.7 10*3/uL (ref 4.0–10.5)
nRBC: 0 % (ref 0.0–0.2)

## 2020-08-30 LAB — BASIC METABOLIC PANEL
Anion gap: 6 (ref 5–15)
BUN: 7 mg/dL (ref 6–20)
CO2: 24 mmol/L (ref 22–32)
Calcium: 9 mg/dL (ref 8.9–10.3)
Chloride: 108 mmol/L (ref 98–111)
Creatinine, Ser: 1.08 mg/dL (ref 0.61–1.24)
GFR calc Af Amer: >60 mL/min (ref >60)
GFR calc non Af Amer: >60 mL/min (ref >60)
Glucose, Bld: 92 mg/dL (ref 70–99)
Potassium: 4.2 mmol/L (ref 3.5–5.1)
Sodium: 138 mmol/L (ref 135–145)

## 2020-08-30 LAB — PHENYTOIN LEVEL, TOTAL: Phenytoin Lvl: 2.8 ug/mL — ABNORMAL LOW (ref 10.0–20.0)

## 2020-08-30 MED ORDER — SODIUM CHLORIDE 0.9 % IV SOLN
10.0000 mg/kg | Freq: Once | INTRAVENOUS | Status: AC
  Administered 2020-08-30: 816 mg via INTRAVENOUS
  Filled 2020-08-30: qty 16.32

## 2020-08-30 MED ORDER — LEVETIRACETAM IN NACL 500 MG/100ML IV SOLN
500.0000 mg | Freq: Once | INTRAVENOUS | Status: AC
  Administered 2020-08-30: 500 mg via INTRAVENOUS
  Filled 2020-08-30: qty 100

## 2020-08-30 MED ORDER — ONDANSETRON 4 MG PO TBDP
4.0000 mg | ORAL_TABLET | Freq: Once | ORAL | Status: AC
  Administered 2020-08-30: 4 mg via ORAL
  Filled 2020-08-30: qty 1

## 2020-08-30 MED ORDER — LORAZEPAM 2 MG/ML IJ SOLN: 1.0000 mg | Freq: Once | INTRAMUSCULAR | Status: AC

## 2020-08-30 MED ORDER — LORAZEPAM 2 MG/ML IJ SOLN
INTRAMUSCULAR | Status: AC
  Administered 2020-08-30: 1 mg via INTRAVENOUS
  Filled 2020-08-30: qty 1
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
## 2020-08-30 NOTE — Discharge Instructions (Signed)
Please ensure you are taking your keppra and dilantin as prescribed. Recommend follow-up with neurology as soon as feasible.

## 2020-08-30 NOTE — ED Notes (Addendum)
RN notified that patient is actively seizing, md notified, verbal order for 1mg  ativan received. Seizure pads already in place, oxygen applied, will continue to monitor.

## 2020-08-30 NOTE — ED Notes (Signed)
Entire chart printed out for patient to take back to the jail.
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
## 2020-08-30 NOTE — ED Notes (Signed)
Patient verbalizes understanding of discharge instructions. Opportunity for questioning and answers were provided. Pt discharged from ED via wheelchair in police custody.

## 2020-09-07 ENCOUNTER — Emergency Department (HOSPITAL_COMMUNITY)

## 2020-09-07 ENCOUNTER — Other Ambulatory Visit: Payer: Self-pay

## 2020-09-07 ENCOUNTER — Emergency Department (HOSPITAL_COMMUNITY)
Admission: EM | Admit: 2020-09-07 | Discharge: 2020-09-07 | Disposition: A | Discharge status: Home / Self Care | Attending: Emergency Medicine | Admitting: Emergency Medicine

## 2020-09-07 DIAGNOSIS — G40909 Epilepsy, unspecified, not intractable, without status epilepticus: Secondary | ICD-10-CM | POA: Insufficient documentation

## 2020-09-07 DIAGNOSIS — F17200 Nicotine dependence, unspecified, uncomplicated: Secondary | ICD-10-CM | POA: Diagnosis not present

## 2020-09-07 DIAGNOSIS — Z20822 Contact with and (suspected) exposure to covid-19: Secondary | ICD-10-CM | POA: Insufficient documentation

## 2020-09-07 DIAGNOSIS — R0602 Shortness of breath: Secondary | ICD-10-CM

## 2020-09-07 DIAGNOSIS — R062 Wheezing: Secondary | ICD-10-CM | POA: Insufficient documentation

## 2020-09-07 DIAGNOSIS — Z7901 Long term (current) use of anticoagulants: Secondary | ICD-10-CM | POA: Diagnosis not present

## 2020-09-07 DIAGNOSIS — Z79899 Other long term (current) drug therapy: Secondary | ICD-10-CM | POA: Diagnosis not present

## 2020-09-07 DIAGNOSIS — E876 Hypokalemia: Secondary | ICD-10-CM

## 2020-09-07 LAB — CBC WITH DIFFERENTIAL/PLATELET
Abs Immature Granulocytes: 0.04 10*3/uL (ref 0.00–0.07)
Basophils Absolute: 0 10*3/uL (ref 0.0–0.1)
Basophils Relative: 1 %
Eosinophils Absolute: 0.1 10*3/uL (ref 0.0–0.5)
Eosinophils Relative: 1 %
HCT: 42.2 % (ref 39.0–52.0)
Hemoglobin: 12.9 g/dL — ABNORMAL LOW (ref 13.0–17.0)
Immature Granulocytes: 1 %
Lymphocytes Relative: 31 %
Lymphs Abs: 1.9 10*3/uL (ref 0.7–4.0)
MCH: 26.9 pg (ref 26.0–34.0)
MCHC: 30.6 g/dL (ref 30.0–36.0)
MCV: 87.9 fL (ref 80.0–100.0)
Monocytes Absolute: 0.4 10*3/uL (ref 0.1–1.0)
Monocytes Relative: 6 %
Neutro Abs: 3.7 10*3/uL (ref 1.7–7.7)
Neutrophils Relative %: 60 %
Platelets: 226 10*3/uL (ref 150–400)
RBC: 4.8 MIL/uL (ref 4.22–5.81)
RDW: 13.9 % (ref 11.5–15.5)
WBC: 6.1 10*3/uL (ref 4.0–10.5)
nRBC: 0 % (ref 0.0–0.2)

## 2020-09-07 LAB — COMPREHENSIVE METABOLIC PANEL
ALT: 33 U/L (ref 0–44)
AST: 22 U/L (ref 15–41)
Albumin: 2.5 g/dL — ABNORMAL LOW (ref 3.5–5.0)
Alkaline Phosphatase: 44 U/L (ref 38–126)
Anion gap: 8 (ref 5–15)
BUN: 8 mg/dL (ref 6–20)
CO2: 17 mmol/L — ABNORMAL LOW (ref 22–32)
Calcium: 7.1 mg/dL — ABNORMAL LOW (ref 8.9–10.3)
Chloride: 116 mmol/L — ABNORMAL HIGH (ref 98–111)
Creatinine, Ser: 0.98 mg/dL (ref 0.61–1.24)
GFR calc non Af Amer: >60 mL/min (ref >60)
Glucose, Bld: 74 mg/dL (ref 70–99)
Potassium: 2.9 mmol/L — ABNORMAL LOW (ref 3.5–5.1)
Sodium: 141 mmol/L (ref 135–145)
Total Bilirubin: 0.4 mg/dL (ref 0.3–1.2)
Total Protein: 5.2 g/dL — ABNORMAL LOW (ref 6.5–8.1)

## 2020-09-07 LAB — PHENYTOIN LEVEL, TOTAL: Phenytoin Lvl: 4.9 ug/mL — ABNORMAL LOW (ref 10.0–20.0)

## 2020-09-07 LAB — MAGNESIUM: Magnesium: 1.7 mg/dL (ref 1.7–2.4)

## 2020-09-07 LAB — RESPIRATORY PANEL BY RT PCR (FLU A&B, COVID)
Influenza A by PCR: NEGATIVE
Influenza B by PCR: NEGATIVE
SARS Coronavirus 2 by RT PCR: NEGATIVE

## 2020-09-07 LAB — RAPID URINE DRUG SCREEN, HOSP PERFORMED
Amphetamines: NOT DETECTED
Barbiturates: NOT DETECTED
Benzodiazepines: NOT DETECTED
Cocaine: NOT DETECTED
Opiates: NOT DETECTED
Tetrahydrocannabinol: NOT DETECTED

## 2020-09-07 MED ORDER — MAGNESIUM SULFATE 2 GM/50ML IV SOLN
2.0000 g | Freq: Once | INTRAVENOUS | Status: AC
  Administered 2020-09-07: 2 g via INTRAVENOUS
  Filled 2020-09-07: qty 50

## 2020-09-07 MED ORDER — LEVETIRACETAM 500 MG PO TABS
1000.0000 mg | ORAL_TABLET | Freq: Once | ORAL | Status: AC
  Administered 2020-09-07: 1000 mg via ORAL
  Filled 2020-09-07: qty 2

## 2020-09-07 MED ORDER — ALBUTEROL SULFATE HFA 108 (90 BASE) MCG/ACT IN AERS
2.0000 | INHALATION_SPRAY | Freq: Once | RESPIRATORY_TRACT | Status: AC
  Administered 2020-09-07: 2 via RESPIRATORY_TRACT
  Filled 2020-09-07: qty 6.7

## 2020-09-07 MED ORDER — POTASSIUM CHLORIDE CRYS ER 20 MEQ PO TBCR
40.0000 meq | EXTENDED_RELEASE_TABLET | Freq: Once | ORAL | Status: AC
  Administered 2020-09-07: 40 meq via ORAL
  Filled 2020-09-07: qty 2

## 2020-09-07 MED ORDER — LORAZEPAM 1 MG PO TABS
1.0000 mg | ORAL_TABLET | Freq: Once | ORAL | Status: AC
  Administered 2020-09-07: 1 mg via ORAL
  Filled 2020-09-07: qty 1

## 2020-09-07 MED ORDER — POTASSIUM CHLORIDE CRYS ER 20 MEQ PO TBCR: 20.0000 meq | EXTENDED_RELEASE_TABLET | Freq: Two times a day (BID) | ORAL | 0 refills | Status: DC

## 2020-09-07 MED ORDER — PHENYTOIN SODIUM EXTENDED 30 MG PO CAPS
150.0000 mg | ORAL_CAPSULE | Freq: Every day | ORAL | Status: DC
  Administered 2020-09-07: 150 mg via ORAL
  Filled 2020-09-07 (×2): qty 5

## 2020-09-07 MED ORDER — POTASSIUM CHLORIDE 10 MEQ/100ML IV SOLN
10.0000 meq | Freq: Once | INTRAVENOUS | Status: AC
  Administered 2020-09-07: 10 meq via INTRAVENOUS

## 2020-09-07 MED ORDER — PHENYTOIN SODIUM EXTENDED 100 MG PO CAPS: 100.0000 mg | ORAL_CAPSULE | Freq: Two times a day (BID) | ORAL | 0 refills | Status: DC

## 2020-09-07 MED ORDER — PHENYTOIN SODIUM EXTENDED 30 MG PO CAPS: 150.0000 mg | ORAL_CAPSULE | Freq: Every day | ORAL | 0 refills | Status: DC

## 2020-09-07 MED ORDER — PHENYTOIN SODIUM EXTENDED 100 MG PO CAPS
100.0000 mg | ORAL_CAPSULE | Freq: Two times a day (BID) | ORAL | Status: DC
  Filled 2020-09-07: qty 1
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
## 2020-09-07 NOTE — Progress Notes (Signed)
Phenytoin Initial Consult Indication: hx seizures  Allergies  Allergen Reactions  . Aspirin     Toxic Shock Syndrome   . Nsaids     Toxic Shock Syndrome    Patient Measurements:     There is no height or weight on file to calculate BMI.   Vital signs: Temp: 98.4 F (36.9 C) (10/07 0525) Temp Source: Oral (10/07 0525) BP: 123/79 (10/07 0846) Pulse Rate: 78 (10/07 0846)  Labs: Lab Results  Component Value Date/Time   Albumin 2.5 (L) 09/07/2020 0545   Phenytoin Lvl 4.9 (L) 09/07/2020 0545   Lab Results  Component Value Date   PHENYTOIN 4.9 (L) 09/07/2020   Estimated Creatinine Clearance: 105.2 mL/min (by C-G formula based on SCr of 0.98 mg/dL).   Assessment:  69 YOM presenting from jail with reported seizure activity resolved before arrival in ED, stable in ED, on Keppra 1000 mg BID, phenytoin 100 mg ER PO TID (300mg /day).  Reports compliance with above medications.    Corrected phenytoin level (if needed): 4.9 mcg/ml (total), corrected to 8.2 mcg/ml with current albumin lvl  Goals of care:  Total phenytoin level: 10-20 mcg/ml Free phenytoin level: 1-2 mcg/ml  Plan:  Increase PTA phenytoin regimen by 50mg /day to 100 mg BID + 150 mg once daily for total of 350mg  daily Recommend f/y phenytoin level in 7 days Pharmacy will continue to follow regarding obtaining total phenytoin levels and dose adjustments as indicated.   , PharmD Clinical Pharmacist ED Pharmacist Phone # [phone] 09/07/2020 9:02 AM

## 2020-09-07 NOTE — ED Provider Notes (Signed)
7:59 AM Care transferred to me.  Labs show normal WBC.  His potassium is moderately low at 2.9.  Has low bicarbonate that could be from seizure but also could be from elevated chloride as he has a normal anion gap.  His calcium appears low but corrected is 8.3.  Will give magnesium, potassium and Dilantin.  Pharmacy is recommending an adjustment of his Dilantin as he is only mildly low when corrected for his albumin.  They are recommending 150 mg for 1 dose and then 100 mg to other doses throughout the day.  I will provide a prescription for this.  He will be discharged after his electrolyte and seizure medicine supplementation.   Pricilla Loveless, MD 09/07/20 1245
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
## 2020-09-07 NOTE — ED Notes (Signed)
Patient verbalizes understanding of discharge instructions. Opportunity for questioning and answers were provided. Pt discharged from ED via wheelchair in custody.

## 2020-09-07 NOTE — ED Notes (Signed)
Pt actively wheezing at time of this RN assessment. Pt has active dry cough, reportedly using recuse inhaler for asthma. Pt this assessed wheezing and diminished breath sounds, upon assessment pt coughing and reporting increased pain with inspiration. Pt has 2 Education officer, environmental at bedside.

## 2020-09-07 NOTE — ED Notes (Signed)
Provided pt with bag lunch and sprite zero per crystal- RN

## 2020-09-07 NOTE — Discharge Instructions (Addendum)
We are changing your Dilantin dosing.  You will now take 150 mg once per day and 100 mg 2 times per day for a total of 3 different doses.  You will be given 2 prescriptions.  Otherwise your potassium was abnormal and so you are being prescribed potassium as well.  - According to [place] law, you can not drive unless you are seizure / syncope free for at least 6 months and under physician's care.    - Please maintain precautions. Do not participate in activities where a loss of awareness could harm you or someone else. No swimming alone, no tub bathing, no hot tubs, no driving, no operating motorized vehicles (cars, ATVs, motocycles, etc), lawnmowers, power tools or firearms. No standing at heights, such as rooftops, ladders or stairs. Avoid hot objects such as stoves, heaters, open fires. Wear a helmet when riding a bicycle, scooter, skateboard, etc. and avoid areas of traffic. Set your water heater to 120 degrees or less.

## 2020-10-06 ENCOUNTER — Other Ambulatory Visit (HOSPITAL_COMMUNITY): Payer: Self-pay | Admitting: Emergency Medicine

## 2020-10-06 ENCOUNTER — Other Ambulatory Visit: Payer: Self-pay

## 2020-10-06 ENCOUNTER — Emergency Department (HOSPITAL_COMMUNITY): Payer: Self-pay

## 2020-10-06 ENCOUNTER — Emergency Department (HOSPITAL_COMMUNITY)
Admission: EM | Admit: 2020-10-06 | Discharge: 2020-10-06 | Disposition: A | Discharge status: Home / Self Care | Payer: Self-pay | Attending: Emergency Medicine | Admitting: Emergency Medicine

## 2020-10-06 DIAGNOSIS — Z653 Problems related to other legal circumstances: Secondary | ICD-10-CM | POA: Insufficient documentation

## 2020-10-06 DIAGNOSIS — Z7901 Long term (current) use of anticoagulants: Secondary | ICD-10-CM | POA: Insufficient documentation

## 2020-10-06 DIAGNOSIS — R569 Unspecified convulsions: Secondary | ICD-10-CM | POA: Insufficient documentation

## 2020-10-06 LAB — RAPID URINE DRUG SCREEN, HOSP PERFORMED
Amphetamines: POSITIVE — AB
Barbiturates: NOT DETECTED
Benzodiazepines: POSITIVE — AB
Cocaine: POSITIVE — AB
Opiates: NOT DETECTED
Tetrahydrocannabinol: POSITIVE — AB

## 2020-10-06 LAB — BASIC METABOLIC PANEL
Anion gap: 10 (ref 5–15)
BUN: 9 mg/dL (ref 6–20)
CO2: 24 mmol/L (ref 22–32)
Calcium: 8.5 mg/dL — ABNORMAL LOW (ref 8.9–10.3)
Chloride: 103 mmol/L (ref 98–111)
Creatinine, Ser: 1.1 mg/dL (ref 0.61–1.24)
GFR, Estimated: >60 mL/min (ref >60)
Glucose, Bld: 94 mg/dL (ref 70–99)
Potassium: 4 mmol/L (ref 3.5–5.1)
Sodium: 137 mmol/L (ref 135–145)

## 2020-10-06 LAB — CBC WITH DIFFERENTIAL/PLATELET
Abs Immature Granulocytes: 0.02 10*3/uL (ref 0.00–0.07)
Basophils Absolute: 0 10*3/uL (ref 0.0–0.1)
Basophils Relative: 1 %
Eosinophils Absolute: 0.1 10*3/uL (ref 0.0–0.5)
Eosinophils Relative: 1 %
HCT: 43.1 % (ref 39.0–52.0)
Hemoglobin: 13.4 g/dL (ref 13.0–17.0)
Immature Granulocytes: 0 %
Lymphocytes Relative: 38 %
Lymphs Abs: 2 10*3/uL (ref 0.7–4.0)
MCH: 27.8 pg (ref 26.0–34.0)
MCHC: 31.1 g/dL (ref 30.0–36.0)
MCV: 89.4 fL (ref 80.0–100.0)
Monocytes Absolute: 0.4 10*3/uL (ref 0.1–1.0)
Monocytes Relative: 7 %
Neutro Abs: 2.7 10*3/uL (ref 1.7–7.7)
Neutrophils Relative %: 53 %
Platelets: 273 10*3/uL (ref 150–400)
RBC: 4.82 MIL/uL (ref 4.22–5.81)
RDW: 13.8 % (ref 11.5–15.5)
WBC: 5.1 10*3/uL (ref 4.0–10.5)
nRBC: 0 % (ref 0.0–0.2)

## 2020-10-06 MED ORDER — APIXABAN (ELIQUIS) EDUCATION KIT FOR DVT/PE PATIENTS
PACK | Freq: Once | Status: DC
  Filled 2020-10-06: qty 1

## 2020-10-06 MED ORDER — SODIUM CHLORIDE 0.9 % IV BOLUS (SEPSIS)
1000.0000 mL | Freq: Once | INTRAVENOUS | Status: AC
  Administered 2020-10-06: 1000 mL via INTRAVENOUS

## 2020-10-06 MED ORDER — APIXABAN (ELIQUIS) VTE STARTER PACK (10MG AND 5MG): ORAL_TABLET | ORAL | 0 refills | Status: DC

## 2020-10-06 MED ORDER — LEVETIRACETAM 500 MG PO TABS
1000.0000 mg | ORAL_TABLET | Freq: Once | ORAL | Status: AC
  Administered 2020-10-06: 1000 mg via ORAL
  Filled 2020-10-06: qty 2

## 2020-10-06 MED ORDER — APIXABAN 5 MG PO TABS: 10.0000 mg | ORAL_TABLET | Freq: Two times a day (BID) | ORAL | Status: DC

## 2020-10-06 MED ORDER — PHENYTOIN SODIUM EXTENDED 100 MG PO CAPS
100.0000 mg | ORAL_CAPSULE | Freq: Once | ORAL | Status: AC
  Administered 2020-10-06: 100 mg via ORAL
  Filled 2020-10-06: qty 1

## 2020-10-06 MED ORDER — APIXABAN 5 MG PO TABS
5.0000 mg | ORAL_TABLET | Freq: Two times a day (BID) | ORAL | Status: DC
  Administered 2020-10-06: 5 mg via ORAL
  Filled 2020-10-06: qty 1

## 2020-10-06 MED ORDER — PHENYTOIN SODIUM EXTENDED 100 MG PO CAPS: 100.0000 mg | ORAL_CAPSULE | Freq: Every day | ORAL | 0 refills | Status: DC

## 2020-10-06 MED ORDER — APIXABAN 5 MG PO TABS: 5.0000 mg | ORAL_TABLET | Freq: Two times a day (BID) | ORAL | 0 refills | Status: DC

## 2020-10-06 MED ORDER — SODIUM CHLORIDE 0.9 % IV SOLN
1000.0000 mL | INTRAVENOUS | Status: DC
  Administered 2020-10-06: 1000 mL via INTRAVENOUS

## 2020-10-06 MED ORDER — LEVETIRACETAM 1000 MG PO TABS: 1000.0000 mg | ORAL_TABLET | Freq: Two times a day (BID) | ORAL | 0 refills | Status: DC

## 2020-10-06 MED FILL — levETIRAcetam 1000 MG TABS: 1000 | 30 days supply | Qty: 60 | Fill #0

## 2020-10-06 MED FILL — ELIQUIS 5 MG TABLET: 5 | 30 days supply | Qty: 60 | Fill #0

## 2020-10-06 MED FILL — PHENYTOIN SODIUM EXTENDED 1: 100 | 30 days supply | Qty: 30 | Fill #0

## 2020-10-06 NOTE — ED Notes (Signed)
Lab will collect lavender cbc

## 2020-10-06 NOTE — ED Notes (Signed)
Pharmacy tech bedside

## 2020-10-06 NOTE — Discharge Instructions (Addendum)
We switched you to Eliquis as a different blood thinner for your clots.  You will have a 30 day supply of this.  In the meantime, you should apply for Medicaide or an insurance plan.  Our case managers helped give you a 30 day supply of your medications.  We will not be able to do this again in the ER this year.  It's really important you look into applying for Medicaide or another option to afford your medications.  The Affordable Care Act (Obamacare) insurance marketplace is open now until the end of December if you want to enroll online.  Go to RuleEnforcement.cz  If you cannot afford any blood thinners, I would recommend that you take four baby aspirins per day (324 mg total).  This is NOT an ideal treatment, and is only recommended until you can get back on blood thinners like Eliquis, Coumadin, or Xarelto.
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
## 2020-10-06 NOTE — ED Notes (Signed)
Seizure pads in place
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
## 2020-10-06 NOTE — ED Notes (Signed)
Pt awake and alert, asking for food. Dr. Renaye Rakers aware.

## 2020-10-06 NOTE — ED Notes (Signed)
Lavender re collect
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
## 2020-10-06 NOTE — ED Notes (Signed)
Pt given sandwich, peanut butter, and crackers as requested.
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
## 2020-10-06 NOTE — ED Notes (Signed)
Redraw on lavender CBC and sent to lab

## 2020-10-06 NOTE — ED Notes (Signed)
Patient belongings hat, wallet, lighter, cell phone.

## 2020-10-06 NOTE — ED Notes (Signed)
Per Bonnell Public and Dr. Renaye Rakers, outpatient pharmacy will bring patient 30 day supply of eliquis to bedside and then pt can be discharged.

## 2020-10-06 NOTE — ED Notes (Signed)
An After Visit Summary was printed and given to the patient. Discharge instructions given and no further questions at this time.  Pt A&Ox4, ambulatory at discharge.

## 2020-10-06 NOTE — ED Notes (Signed)
Per Cathlean Cower, CM patient given bus pass.

## 2020-10-06 NOTE — ED Notes (Addendum)
Per Doroteo Glassman, charge RN this RN does not need to call [employer] regarding patients upcoming discharge. No [employer] at patients bedside at this time.

## 2020-10-06 NOTE — ED Notes (Signed)
Christine, Pharm and Dr. Renaye Rakers clarified that pt is receive Eliquis 5mg  PO dose now.

## 2020-10-06 NOTE — ED Notes (Signed)
Cathlean Cower, CM gave this RN patients 30 day supply of meds. Meds have been given to patient. Pt has no further questions regarding meds at this time.

## 2020-10-06 NOTE — ED Notes (Signed)
Peanut butter and crackers given to pt as requested.

## 2020-10-06 NOTE — ED Notes (Signed)
ED Provider at bedside. 

## 2020-10-06 NOTE — ED Notes (Signed)
Lab in room to collect lab

## 2020-10-06 NOTE — ED Notes (Signed)
Called lab, need recollect on lavender. Will perform and send to lab

## 2020-10-09 LAB — PHENYTOIN LEVEL, FREE AND TOTAL
Phenytoin, Free: NOT DETECTED ug/mL (ref 1.0–2.0)
Phenytoin, Total: <0.8 ug/mL — ABNORMAL LOW (ref 10.0–20.0)

## 2020-10-29 ENCOUNTER — Observation Stay (HOSPITAL_COMMUNITY)
Admission: EM | Admit: 2020-10-29 | Discharge: 2020-10-30 | Disposition: A | Discharge status: Home / Self Care | Payer: Self-pay | Attending: Internal Medicine | Admitting: Internal Medicine

## 2020-10-29 ENCOUNTER — Encounter (HOSPITAL_COMMUNITY): Payer: Self-pay

## 2020-10-29 ENCOUNTER — Other Ambulatory Visit: Payer: Self-pay

## 2020-10-29 ENCOUNTER — Emergency Department (HOSPITAL_COMMUNITY): Payer: Self-pay

## 2020-10-29 DIAGNOSIS — N179 Acute kidney failure, unspecified: Secondary | ICD-10-CM | POA: Diagnosis present

## 2020-10-29 DIAGNOSIS — Z20822 Contact with and (suspected) exposure to covid-19: Secondary | ICD-10-CM | POA: Insufficient documentation

## 2020-10-29 DIAGNOSIS — I82409 Acute embolism and thrombosis of unspecified deep veins of unspecified lower extremity: Secondary | ICD-10-CM

## 2020-10-29 DIAGNOSIS — R569 Unspecified convulsions: Principal | ICD-10-CM

## 2020-10-29 LAB — COMPREHENSIVE METABOLIC PANEL
ALT: 11 U/L (ref 0–44)
AST: 21 U/L (ref 15–41)
Albumin: 3.5 g/dL (ref 3.5–5.0)
Alkaline Phosphatase: 57 U/L (ref 38–126)
Anion gap: 9 (ref 5–15)
BUN: 8 mg/dL (ref 6–20)
CO2: 24 mmol/L (ref 22–32)
Calcium: 8.9 mg/dL (ref 8.9–10.3)
Chloride: 106 mmol/L (ref 98–111)
Creatinine, Ser: 1.42 mg/dL — ABNORMAL HIGH (ref 0.61–1.24)
GFR, Estimated: >60 mL/min (ref >60)
Glucose, Bld: 101 mg/dL — ABNORMAL HIGH (ref 70–99)
Potassium: 4.4 mmol/L (ref 3.5–5.1)
Sodium: 139 mmol/L (ref 135–145)
Total Bilirubin: 1.2 mg/dL (ref 0.3–1.2)
Total Protein: 6.5 g/dL (ref 6.5–8.1)

## 2020-10-29 LAB — CBG MONITORING, ED: Glucose-Capillary: 111 mg/dL — ABNORMAL HIGH (ref 70–99)

## 2020-10-29 LAB — RAPID URINE DRUG SCREEN, HOSP PERFORMED
Amphetamines: POSITIVE — AB
Barbiturates: NOT DETECTED
Benzodiazepines: POSITIVE — AB
Cocaine: POSITIVE — AB
Opiates: NOT DETECTED
Tetrahydrocannabinol: POSITIVE — AB

## 2020-10-29 LAB — RESP PANEL BY RT-PCR (FLU A&B, COVID) ARPGX2
Influenza A by PCR: NEGATIVE
Influenza B by PCR: NEGATIVE
SARS Coronavirus 2 by RT PCR: NEGATIVE

## 2020-10-29 LAB — PHENYTOIN LEVEL, TOTAL: Phenytoin Lvl: <2.5 ug/mL — ABNORMAL LOW (ref 10.0–20.0)

## 2020-10-29 LAB — HIV ANTIBODY (ROUTINE TESTING W REFLEX): HIV Screen 4th Generation wRfx: NONREACTIVE

## 2020-10-29 MED ORDER — APIXABAN 5 MG PO TABS
5.0000 mg | ORAL_TABLET | Freq: Two times a day (BID) | ORAL | Status: DC
  Administered 2020-10-29 – 2020-10-30 (×2): 5 mg via ORAL
  Filled 2020-10-29 (×2): qty 1

## 2020-10-29 MED ORDER — ACETAMINOPHEN 650 MG RE SUPP: 650.0000 mg | RECTAL | Status: DC | PRN

## 2020-10-29 MED ORDER — LEVETIRACETAM IN NACL 1000 MG/100ML IV SOLN
1000.0000 mg | Freq: Once | INTRAVENOUS | Status: AC
  Administered 2020-10-29: 1000 mg via INTRAVENOUS
  Filled 2020-10-29: qty 100

## 2020-10-29 MED ORDER — ONDANSETRON HCL 4 MG/2ML IJ SOLN: 4.0000 mg | Freq: Four times a day (QID) | INTRAMUSCULAR | Status: DC | PRN

## 2020-10-29 MED ORDER — SODIUM CHLORIDE 0.9 % IV SOLN: 2000.0000 mg | Freq: Once | INTRAVENOUS | Status: DC

## 2020-10-29 MED ORDER — PHENYTOIN SODIUM EXTENDED 100 MG PO CAPS
100.0000 mg | ORAL_CAPSULE | Freq: Every day | ORAL | Status: DC
  Administered 2020-10-30: 100 mg via ORAL
  Filled 2020-10-29: qty 1

## 2020-10-29 MED ORDER — SODIUM CHLORIDE 0.9 % IV SOLN
75.0000 mL/h | INTRAVENOUS | Status: DC
  Administered 2020-10-29: 75 mL/h via INTRAVENOUS

## 2020-10-29 MED ORDER — POLYETHYLENE GLYCOL 3350 17 G PO PACK: 17.0000 g | PACK | Freq: Every day | ORAL | Status: DC | PRN

## 2020-10-29 MED ORDER — DOCUSATE SODIUM 100 MG PO CAPS
100.0000 mg | ORAL_CAPSULE | Freq: Two times a day (BID) | ORAL | Status: DC
  Filled 2020-10-29 (×2): qty 1

## 2020-10-29 MED ORDER — LEVETIRACETAM 500 MG PO TABS
1000.0000 mg | ORAL_TABLET | Freq: Two times a day (BID) | ORAL | Status: DC
  Administered 2020-10-29 – 2020-10-30 (×2): 1000 mg via ORAL
  Filled 2020-10-29 (×2): qty 2

## 2020-10-29 MED ORDER — ONDANSETRON HCL 4 MG PO TABS: 4.0000 mg | ORAL_TABLET | Freq: Four times a day (QID) | ORAL | Status: DC | PRN

## 2020-10-29 MED ORDER — LORAZEPAM 2 MG/ML IJ SOLN: 1.0000 mg | INTRAMUSCULAR | Status: DC | PRN

## 2020-10-29 MED ORDER — ACETAMINOPHEN 325 MG PO TABS: 650.0000 mg | ORAL_TABLET | ORAL | Status: DC | PRN

## 2020-10-29 NOTE — ED Notes (Signed)
Attempted report x1. 
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
## 2020-10-29 NOTE — ED Notes (Signed)
Patient transported to CT 

## 2020-10-29 NOTE — Consult Note (Signed)
NEUROLOGY CONSULTATION NOTE   Date of service: October 29, 2020 Patient Name: John Flynn MRN:  [id number] DOB:  [birth year] Reason for consult: "Status epilepticus"  History of Present Illness  John Flynn is a 34 y.o. male with PMH significant for DVTs and Seizures who is supposed to be on Keppra and Dilantin who was brought in by police for a seizure. He is non compliant with medications per prior notes due to difficulty with affording medications.  History obtained from [facility] deputy who was present outside the room.  Patient was arrested today, asked for his inhaler, with seeming overall unwell and so was helped down to the ground where he lost consciousness and had convulsions concerning for a seizure.  They called medics and nurse and he was brought into the emergency department.  He was given Versed in the ambulance.  He was noted to have some rigidity of the right side by the ED providers and we were asked to assess this patient.  On my evaluation, patient sits up to touch.  When asked what is going on, he responds with I do not know.  He states that he is very tired and goes right back to sleep.  I did not notice any arm or leg twitching or weakness, his speech was fluent and his comprehension seemed intact.   ROS  Unable to obtain a detailed review of system as patient has difficulty maintaining wakefulness after getting Versed. Past History   Past Medical History:  Diagnosis Date  . Blood clot in vein   . Seizures (HCC)    on keppra   History reviewed. No pertinent surgical history. History reviewed. No pertinent family history. Social History   Socioeconomic History  . Marital status: Married    Spouse name: Not on file  . Number of children: Not on file  . Years of education: Not on file  . Highest education level: Not on file  Occupational History  . Not on file  Tobacco Use  . Smoking status: Never Smoker  . Smokeless tobacco: Never Used  Substance  and Sexual Activity  . Alcohol use: Yes  . Drug use: Yes  . Sexual activity: Not on file  Other Topics Concern  . Not on file  Social History Narrative  . Not on file   Social Determinants of Health   Financial Resource Strain:   . Difficulty of Paying Living Expenses: Not on file  Food Insecurity:   . Worried About Programme researcher, broadcasting/film/video in the Last Year: Not on file  . Ran Out of Food in the Last Year: Not on file  Transportation Needs:   . Lack of Transportation (Medical): Not on file  . Lack of Transportation (Non-Medical): Not on file  Physical Activity:   . Days of Exercise per Week: Not on file  . Minutes of Exercise per Session: Not on file  Stress:   . Feeling of Stress : Not on file  Social Connections:   . Frequency of Communication with Friends and Family: Not on file  . Frequency of Social Gatherings with Friends and Family: Not on file  . Attends Religious Services: Not on file  . Active Member of Clubs or Organizations: Not on file  . Attends Banker Meetings: Not on file  . Marital Status: Not on file   Allergies  Allergen Reactions  . Aspirin     Toxic Shock Syndrome   . Nsaids     Toxic Shock Syndrome  Medications  (Not in a hospital admission)    Vitals   Vitals:   10/29/20 1135 10/29/20 1200  BP: 116/84 (!) 135/109  Pulse: 74 75  Resp: 18 10  Temp: 98.3 F (36.8 C)   TempSrc: Axillary   SpO2: 98% 100%     There is no height or weight on file to calculate BMI.  Physical Exam   General: Laying comfortably in bed; in no acute distress. HENT: Normal oropharynx and mucosa. Normal external appearance of ears and nose. Neck: Supple, no pain or tenderness CV: No JVD. No peripheral edema. Pulmonary: Symmetric Chest rise. Normal respiratory effort. Abdomen: Soft to touch, non-tender. Ext: No cyanosis, edema, or deformity Skin: No rash. Normal palpation of skin.  Musculoskeletal: Normal digits and nails by inspection. No  clubbing.  Neurologic Examination  Mental status/Cognition: Eyes closed, opens eyes to touch.  Tracks my face, sits up in the bed.  Replies with I do not know what is going on and states that he is very tired and goes back to sleep. Speech/language: Fluent, comprehension intact to simple commands.  Cranial nerves:   CN II Pupils equal and reactive to light   CN III,IV,VI EOM intact, no gaze preference or deviation, no nystagmus   CN V    CN VII no asymmetry, no nasolabial fold flattening   CN VIII    CN IX & X    CN XI    CN XII    Motor:  Muscle bulk: normal, tone normal Unable to do a detailed strength exam due to his somnolence after getting Versed.  He is spontaneously moving all extremities and does sit up in the bed.  Reflexes:  Right Left Comments  Pectoralis      Biceps (C5/6) 1 1   Brachioradialis (C5/6) 1 1    Triceps (C6/7) 1 1    Patellar (L3/4) 1 1    Achilles (S1)      Hoffman      Plantar     Jaw jerk    Sensation: Localizes to noxious stimuli in all extremities.  Coordination/Complex Motor:  Unable to assess coordination however no ataxia noted.  Labs   CBC: No results for input(s): WBC, NEUTROABS, HGB, HCT, MCV, PLT in the last 168 hours.  Basic Metabolic Panel:  Lab Results  Component Value Date   NA 137 10/06/2020   K 4.0 10/06/2020   CO2 24 10/06/2020   GLUCOSE 94 10/06/2020   BUN 9 10/06/2020   CREATININE 1.10 10/06/2020   CALCIUM 8.5 (L) 10/06/2020   GFRNONAA >60 10/06/2020   GFRAA >60 08/30/2020   Lipid Panel: No results found for: LDLCALC HgbA1c: No results found for: HGBA1C Urine Drug Screen:     Component Value Date/Time   LABOPIA NONE DETECTED 10/06/2020 0948   COCAINSCRNUR POSITIVE (A) 10/06/2020 0948   LABBENZ POSITIVE (A) 10/06/2020 0948   AMPHETMU POSITIVE (A) 10/06/2020 0948   THCU POSITIVE (A) 10/06/2020 0948   LABBARB NONE DETECTED 10/06/2020 0948    Alcohol Level     Component Value Date/Time   ETH <10 11/17/2018  2057     Results for orders placed during the hospital encounter of 10/06/20  CT Head Wo Contrast IMPRESSION: Normal examination.   Impression   John Flynn is a 34 y.o. male with PMH significant for DVTs and Seizures who is supposed to be on Keppra and Dilantin who was brought in by police for a seizure. The seizure seems to have been a  prolonged one and he now appears post ictal. Recommend Keppra load of 2G IV once and resuming home dose of Keppra and Dilantin.  Recommendations  - Keppra 2G IV once and resume home dose of Keppra and Dilantin - Given prolonged seizure, monitor overnight to ensure no further seizures. If no more seizures, can be discharged with outpatient follow up. - No driving for 6 months, has to be cleared by neurology before he can resume driving. ______________________________________________________________________   Thank you for the opportunity to take part in the care of this patient. If you have any further questions, please contact the neurology consultation attending.  Signed,  Erick Blinks [facility] Pager Number [phone]

## 2020-10-29 NOTE — ED Provider Notes (Signed)
[facility] EMERGENCY DEPARTMENT Provider Note   CSN: [id number] Arrival date & time: 10/29/20  1131     History Chief Complaint  Patient presents with  . Seizures    John Flynn is a 34 y.o. 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 Deputy who witnessed the seizure states patient had convulsions which were concerning for a seizure that lasted roughly 2 minutes. Patient was seated during the event and moved to the floor. No head injury. Patient was postictal following the event. He was given Versed 5mg  following the incident. No urinary incontinence. Chart reviewed. It appears patient has a history of medication noncompliant. Unsure if he has taken his seizure and anticoagulants as prescribed. During my initial evaluation, patient sleeping in bed and unable to answer any questions.   Level 5 caveat secondary to mental status change  History obtained from deputy and past medical records. No interpreter used during encounter.      Past Medical History:  Diagnosis Date  . Blood clot in vein   . Seizures (HCC)    on keppra    There are no problems to display for this patient.   History reviewed. No pertinent surgical history.     History reviewed. No pertinent family history.  Social History   Tobacco Use  . Smoking status: Never Smoker  . Smokeless tobacco: Never Used  Substance Use Topics  . Alcohol use: Yes  . Drug use: Yes    Home Medications Prior to Admission medications   Medication Sig Start Date End Date Taking? Authorizing Provider  apixaban (ELIQUIS) 5 MG TABS tablet Take 1 tablet (5 mg total) by mouth 2 (two) times daily. 10/06/20 11/05/20  14/5/21, MD  levETIRAcetam (KEPPRA) 1000 MG tablet Take 1 tablet (1,000 mg total) by mouth 2 (two) times daily. 10/06/20 11/05/20   14/5/21, MD  levETIRAcetam (KEPPRA) 500 MG tablet Take 2 tablets (1,000 mg total) by mouth 2 (two) times daily. Patient not taking: Reported on 10/06/2020 08/08/20   10/08/20, MD  phenytoin (DILANTIN) 100 MG ER capsule Take 1 capsule (100 mg total) by mouth 2 (two) times daily. Patient not taking: Reported on 10/06/2020 09/07/20   11/07/20, MD  phenytoin (DILANTIN) 100 MG ER capsule Take 1 capsule (100 mg total) by mouth daily. 10/06/20 11/05/20  14/5/21, MD  phenytoin (DILANTIN) 30 MG ER capsule Take 5 capsules (150 mg total) by mouth daily. Patient not taking: Reported on 10/06/2020 09/07/20 10/07/20  13/6/21, MD  RIVAROXABAN Pricilla Loveless) VTE STARTER PACK (15 & 20 MG TABLETS) Follow package directions: Take one 15mg  tablet by mouth twice a day. On day 22, switch to one 20mg  tablet once a day. Take with food. Patient not taking: Reported on 10/06/2020 06/06/20   , PA-C    Allergies    Aspirin and Nsaids  Review of Systems   Review of Systems  Unable to perform ROS: Mental status change    Physical Exam Updated Vital Signs BP (!) 140/105   Pulse 65   Temp 98.3 F (36.8 C)   Resp 12   SpO2 99%   Physical Exam Vitals and nursing note reviewed.  Constitutional:      General: He is not in acute distress.    Comments: Difficult to arouse  HENT:  Head: Normocephalic.  Eyes:     Pupils: Pupils are equal, round, and reactive to light.  Cardiovascular:     Rate and Rhythm: Normal rate and regular rhythm.     Pulses: Normal pulses.     Heart sounds: Normal heart sounds. No murmur heard.  No friction rub. No gallop.   Pulmonary:     Effort: Pulmonary effort is normal.     Breath sounds: Normal breath sounds.     Comments: Respirations equal and unlabored, patient able to speak in full sentences, lungs clear to auscultation bilaterally Abdominal:     General: Abdomen is flat. Bowel sounds are normal. There is no distension.      Palpations: Abdomen is soft.     Tenderness: There is no abdominal tenderness. There is no guarding or rebound.  Musculoskeletal:     Cervical back: Neck supple.     Comments: No lower extremity edema.   Skin:    General: Skin is warm and dry.  Neurological:     General: No focal deficit present.     Comments: Postictal state. Difficult to arouse. Pupils equal and reactive to light.  Psychiatric:        Mood and Affect: Mood normal.        Behavior: Behavior normal.     ED Results / Procedures / Treatments   Labs (all labs ordered are listed, but only abnormal results are displayed) Labs Reviewed  COMPREHENSIVE METABOLIC PANEL - Abnormal; Notable for the following components:      Result Value   Glucose, Bld 101 (*)    Creatinine, Ser 1.42 (*)    All other components within normal limits  CBG MONITORING, ED - Abnormal; Notable for the following components:   Glucose-Capillary 111 (*)    All other components within normal limits  CBC WITH DIFFERENTIAL/PLATELET  RAPID URINE DRUG SCREEN, HOSP PERFORMED  PHENYTOIN LEVEL, TOTAL    EKG None  Radiology CT Head Wo Contrast  Result Date: 10/29/2020 CLINICAL DATA:  Nontraumatic seizure. EXAM: CT HEAD WITHOUT CONTRAST TECHNIQUE: Contiguous axial images were obtained from the base of the skull through the vertex without intravenous contrast. COMPARISON:  October 06, 2020 FINDINGS: Brain: No evidence of acute infarction, hemorrhage, hydrocephalus, extra-axial collection or mass lesion/mass effect. Vascular: No hyperdense vessel or unexpected calcification. Skull: Normal. Negative for fracture or focal lesion. Sinuses/Orbits: No acute finding. Other: None. IMPRESSION: No acute intracranial abnormality. Electronically Signed   By: Ted Mcalpine M.D.   On: 10/29/2020 12:49    Procedures Procedures (including critical care time)  Medications Ordered in ED Medications  levETIRAcetam (KEPPRA) IVPB 1000 mg/100 mL premix (1,000 mg  Intravenous New Bag/Given 10/29/20 1415)  levETIRAcetam (KEPPRA) IVPB 1000 mg/100 mL premix (1,000 mg Intravenous New Bag/Given 10/29/20 1414)    ED Course  I have reviewed the triage vital signs and the nursing notes.  Pertinent labs & imaging results that were available during my care of the patient were reviewed by me and considered in my medical decision making (see chart for details).  Clinical Course as of Oct 30 1427  Wynelle Link Oct 29, 2020  1239 Discussed case with Dr. Derry Lory with neurology due to concerns about status epilepticus who recommends giving 2g Keppra. Ordered placed. Pharmacist called to ensure medication is started STAT   [CA]  1258 Discussed case with Dr. Derry Lory with neurology who notes low suspicion for status, but recommends observation overnight given possible multiple seizures vs. Prolonged seizures. COVID test ordered. Loading  dose of Keppra given    [CA]  1322 Creatinine(!): 1.42 [CA]  1427 Discussed case with Dr. Ophelia Charter who agrees to admit patient for further evaluation   [CA]    Clinical Course User Index [CA] Audelia Acton Raina Mina   MDM Rules/Calculators/A&P                         34 year old male presents to the ED after a witnessed seizure.  Chart reviewed.  History of same.  History of noncompliance with home medications.  Upon arrival, vitals all within normal limits.  Patient appears possibly postictal during my initial evaluation. Patient hard to arouse. Won't answer any questions. Discussed case with Dr. Derry Lory with neurology due to rigidity and possible status epilepticus. See note above. Routine labs obtained to rule out electrolyte abnormalities and infectious etiology. CT head ordered. Loading dose of Keppra given.  CMP significant for elevated creatinine at 1.42 and mild hyperglycemia at 101, but otherwise unremarkable.  CT head personally reviewed which is negative for any acute abnormalities.  EKG personally reviewed which demonstrates  normal sinus rhythm with no signs of acute ischemia.  2:17 PM reassessed patient at bedside.  Patient opens eyes to sound, but still unable to answer any questions.   Discussed case with Dr. Ophelia Charter who agrees to admit patient for further treatment. COVID test pending.   Final Clinical Impression(s) / ED Diagnoses Final diagnoses:  Seizure-like activity [facility])    Rx / DC Orders ED Discharge Orders    None       Jesusita Oka 10/29/20 1432    Margarita Grizzle, MD 10/30/20 1308

## 2020-10-29 NOTE — ED Triage Notes (Addendum)
Pt BIB EMS from jail. Pt was at the jail house he he had a seizure. Seizure lasted 2 mins.Pt was sitting at the time of the seizure. Cops helped him to the floor. Cops states he didn't hit his head.EMS EMS gave midazolam 5mg .

## 2020-10-29 NOTE — ED Notes (Signed)
Pt returned to [facility] from [facility]

## 2020-10-30 LAB — CBC
HCT: 45 % (ref 39.0–52.0)
Hemoglobin: 14.2 g/dL (ref 13.0–17.0)
MCH: 27.5 pg (ref 26.0–34.0)
MCHC: 31.6 g/dL (ref 30.0–36.0)
MCV: 87.2 fL (ref 80.0–100.0)
Platelets: 280 10*3/uL (ref 150–400)
RBC: 5.16 MIL/uL (ref 4.22–5.81)
RDW: 12.9 % (ref 11.5–15.5)
WBC: 5.4 10*3/uL (ref 4.0–10.5)
nRBC: 0 % (ref 0.0–0.2)

## 2020-10-30 LAB — COMPREHENSIVE METABOLIC PANEL
ALT: 13 U/L (ref 0–44)
AST: 15 U/L (ref 15–41)
Albumin: 3.1 g/dL — ABNORMAL LOW (ref 3.5–5.0)
Alkaline Phosphatase: 53 U/L (ref 38–126)
Anion gap: 6 (ref 5–15)
BUN: 9 mg/dL (ref 6–20)
CO2: 25 mmol/L (ref 22–32)
Calcium: 8.5 mg/dL — ABNORMAL LOW (ref 8.9–10.3)
Chloride: 105 mmol/L (ref 98–111)
Creatinine, Ser: 1.23 mg/dL (ref 0.61–1.24)
GFR, Estimated: >60 mL/min (ref >60)
Glucose, Bld: 115 mg/dL — ABNORMAL HIGH (ref 70–99)
Potassium: 3.6 mmol/L (ref 3.5–5.1)
Sodium: 136 mmol/L (ref 135–145)
Total Bilirubin: 0.4 mg/dL (ref 0.3–1.2)
Total Protein: 6.2 g/dL — ABNORMAL LOW (ref 6.5–8.1)

## 2020-10-30 MED ORDER — LIDOCAINE VISCOUS HCL 2 % MT SOLN
15.0000 mL | Freq: Once | OROMUCOSAL | Status: AC
  Administered 2020-10-30: 15 mL via ORAL
  Filled 2020-10-30: qty 15

## 2020-10-30 MED ORDER — ALUM & MAG HYDROXIDE-SIMETH 200-200-20 MG/5ML PO SUSP
30.0000 mL | Freq: Once | ORAL | Status: AC
  Administered 2020-10-30: 30 mL via ORAL
  Filled 2020-10-30: qty 30

## 2020-10-30 NOTE — TOC Initial Note (Addendum)
Transition of Care [facility]) - Initial/Assessment Note    Patient Details  Name: John Flynn MRN: [id number] Date of Birth: 10-31-1986  [facility] CM/SW Contact:    Kingsley Plan, RN Phone Number: 10/30/2020, 10:54 AM  Clinical Narrative:                 Patient in police custody. In progression patient potential discharge for today.   NCM called [employer] and spoke with Tomi Likens DON. Etheleen Mayhew DON requested records fax to her at [phone], faxed medication list and H and P , will fax discharge summary when available.   Stephanie Leach DON cell [phone] fax [phone].   1420 Spoke to Tomi Likens DON at [facility]. She did not receive fax, she instructed NCM to have bedside nurse send a copy of the discharge summary with officers and to call report to [place] at [phone]. Bedside nurse aware.  [facility]charge [facility]: [facility] Barriers to Discharge: No Barriers Identified   Patient Goals and CMS Choice     Choice offered to / list presented to : NA  Expected Discharge Plan and Services Expected Discharge Plan: [facility]rections Facility     Post Acute Care Choice: NA                   DME Arranged: N/A DME Agency: NA       HH Arranged: NA          Prior Living Arrangements/Services   Lives with:: Self                   Activities of Daily Living Home Assistive Devices/Equipment: None ADL Screening (condition at time of admission) Patient's cognitive ability adequate to safely complete daily activities?: Yes Is the patient deaf or have difficulty hearing?: No Does the patient have difficulty seeing, even when wearing glasses/contacts?: No Does the patient have difficulty concentrating, remembering, or making decisions?: No Patient able to express need for assistance with ADLs?: Yes Does the patient have difficulty dressing or bathing?: Yes Independently performs ADLs?:  Yes (appropriate for developmental age) Does the patient have difficulty walking or climbing stairs?: No Weakness of Legs: None Weakness of Arms/Hands: None  Permission Sought/Granted   Permission granted to share information with : No              Emotional Assessment              Admission diagnosis:  Seizure (HCC) [R56.9] Seizure-like activity (HCC) [R56.9] Patient Active Problem List   Diagnosis Date Noted  . Seizure (HCC) 10/29/2020  . AKI (acute kidney injury) (HCC) 10/29/2020  . DVT (deep venous thrombosis) (HCC) 10/29/2020   PCP:  Patient, No Pcp Per Pharmacy:   [facility] - [place], [place] - [address] [place] [address] Phone: [phone] Fax: [phone]  [facility] - [place], [place] - [address] [place] [address] [place] [address] Phone: [phone] Fax: [phone]  [facility] - [place], [place] - [address] [place] [address] Phone: [phone] Fax: [phone]     Social Determinants of Health (SDOH) Interventions    Readmission Risk Interventions No flowsheet data found.

## 2020-10-30 NOTE — Progress Notes (Signed)
Patient discharged via wheelchair with Deputy back to jail. Paperwork given to [employer].

## 2020-10-30 NOTE — Progress Notes (Signed)
Due to hx of non-compliance and interaction issue apixaban and phenytoin, ok to stop his apixaban per Dr. Benjamine Mola.   Ulyses Southward, PharmD, BCIDP, AAHIVP, CPP Infectious Disease Pharmacist 10/30/2020 8:48 AM

## 2020-10-30 NOTE — Progress Notes (Signed)
Report called to [facility] DON at jail. Paperwork given to Deputy at bedside.

## 2020-10-30 NOTE — Discharge Summary (Addendum)
Physician Discharge Summary  Kym Fenter KCL:[id number] DOB: 07-10-1986 DOA: 10/29/2020  PCP: Patient, No Pcp Per  Admit date: 10/29/2020 Discharge date: 10/30/2020  Admitted From: jail Discharge disposition: jail   Recommendations for Outpatient Follow-Up:   1. Patient needs to take his seizure medications to avoid further issues 2. Has reached maximal benefit from hospitalization and may return to jail as long as his seizure medications can be provided 3. Driving restrictions for 6 months or until seizure free   Discharge Diagnosis:   Principal Problem:   Seizure (HCC) Active Problems:   AKI (acute kidney injury) (HCC)   DVT (deep venous thrombosis) (HCC)    Discharge Condition: Improved.  Diet recommendation:  Regular.  Wound care: None.  Code status: Full.   History of Present Illness:   John Flynn is a 34 y.o. male with medical history significant of DVT on Eliquis and seizure d/o presenting with seizure activity while in jail.  The patient is presenting from the [facility] - it is not clear why he is currently incarcerated but prior convictions have been for drug-related offenses, assault on a male, and burglary/larceny.  This is his th visit for seizure-related activity in the last 6 months and he also was seen for chronic DVT in 06/2020.  When he was last seen in the ER on 11/5, he was brought in in police custody but was not incarcerated, having been related from prison 2 weeks prior.  He was post-ictal at the time of my evaluation and unable to answer any questions.   Hospital Course by Problem:   Seizure -Patient with known h/o seizures presenting with breakthrough seizures -Very likely associated with medication noncompliance in the setting of repeat incarcerations -Neurology has seen the patient -He was loaded with 2 grams of IV Keppra and home meds were resumed (Keppra 1 gram BID and Dilantin 100 mg daily) -He also will  need driving restriction for at least 6 months -Seizure precautions -the patient is safe to be discharged  AKI -Mild AKI, likely associated with increased exertion (seizures) with minimal PO intake -resolved  High risk social behavior/polysubstance abuse -Patient with repeated incarcerations -drug abuse  DVT -Initially started on Xarelto but unable to afford -Transitioned to Eliquis with 1 month supply coupon on 11/5 -Initial episode was on 7/6 with age-indeterminate R popliteal DVT and otherwise chronic R DVTs -CTA at that time was negative and there is no other obvious evidence of prior PE -Based on this information, it does not appear that the patient has to have ongoing AC -will d/c due to interactions with seizure meds -If he has another VTE event in his lifetime, it would appear he would then need lifetime [facility]     Medical Consultants:    Neurology: patient needs to be compliant with meds  Discharge Exam:   Vitals:   10/30/20 0424 10/30/20 1220  BP: (!) 131/96 134/87  Pulse: 70 63  Resp: 16 16  Temp: 98.6 F (37 C) 98.4 F (36.9 C)  SpO2: 100% 100%   Vitals:   10/29/20 2026 10/29/20 2352 10/30/20 0424 10/30/20 1220  BP: (!) 123/92 121/80 (!) 131/96 134/87  Pulse: 66 63 70 63  Resp: 14 13 16 16   Temp: 98.4 F (36.9 C) 98 F (36.7 C) 98.6 F (37 C) 98.4 F (36.9 C)  TempSrc: Oral Axillary Oral Oral  SpO2: 100% 99% 100% 100%    General exam: Appears calm and comfortable. Nursing reports that  patient ate all night and no further seizures noted  The results of significant diagnostics from this hospitalization (including imaging, microbiology, ancillary and laboratory) are listed below for reference.     Procedures and Diagnostic Studies:   CT Head Wo Contrast  Result Date: 10/29/2020 CLINICAL DATA:  Nontraumatic seizure. EXAM: CT HEAD WITHOUT CONTRAST TECHNIQUE: Contiguous axial images were obtained from the base of the skull through the vertex  without intravenous contrast. COMPARISON:  October 06, 2020 FINDINGS: Brain: No evidence of acute infarction, hemorrhage, hydrocephalus, extra-axial collection or mass lesion/mass effect. Vascular: No hyperdense vessel or unexpected calcification. Skull: Normal. Negative for fracture or focal lesion. Sinuses/Orbits: No acute finding. Other: None. IMPRESSION: No acute intracranial abnormality. Electronically Signed   By: Ted Mcalpine M.D.   On: 10/29/2020 12:49     Labs:   Basic Metabolic Panel: Recent Labs  Lab 10/29/20 1148 10/30/20 0508  NA 139 136  K 4.4 3.6  CL 106 105  CO2 24 25  GLUCOSE 101* 115*  BUN 8 9  CREATININE 1.42* 1.23  CALCIUM 8.9 8.5*   GFR CrCl cannot be calculated (Unknown ideal weight.). Liver Function Tests: Recent Labs  Lab 10/29/20 1148 10/30/20 0508  AST 21 15  ALT 11 13  ALKPHOS 57 53  BILITOT 1.2 0.4  PROT 6.5 6.2*  ALBUMIN 3.5 3.1*   No results for input(s): LIPASE, AMYLASE in the last 168 hours. No results for input(s): AMMONIA in the last 168 hours. Coagulation profile No results for input(s): INR, PROTIME in the last 168 hours.  CBC: Recent Labs  Lab 10/30/20 0508  WBC 5.4  HGB 14.2  HCT 45.0  MCV 87.2  PLT 280   Cardiac Enzymes: No results for input(s): CKTOTAL, CKMB, CKMBINDEX, TROPONINI in the last 168 hours. BNP: Invalid input(s): POCBNP CBG: Recent Labs  Lab 10/29/20 1145  GLUCAP 111*   D-Dimer No results for input(s): DDIMER in the last 72 hours. Hgb A1c No results for input(s): HGBA1C in the last 72 hours. Lipid Profile No results for input(s): CHOL, HDL, LDLCALC, TRIG, CHOLHDL, LDLDIRECT in the last 72 hours. Thyroid function studies No results for input(s): TSH, T4TOTAL, T3FREE, THYROIDAB in the last 72 hours.  Invalid input(s): FREET3 Anemia work up No results for input(s): VITAMINB12, FOLATE, FERRITIN, TIBC, IRON, RETICCTPCT in the last 72 hours. Microbiology Recent Results (from the past 240  hour(s))  Resp Panel by RT-PCR (Flu A&B, Covid) Nasopharyngeal Swab     Status: None   Collection Time: 10/29/20  3:00 PM   Specimen: Nasopharyngeal Swab; Nasopharyngeal(NP) swabs in vial transport medium  Result Value Ref Range Status   SARS Coronavirus 2 by RT PCR NEGATIVE NEGATIVE Final    Comment: (NOTE) SARS-CoV-2 target nucleic acids are NOT DETECTED.  The SARS-CoV-2 RNA is generally detectable in upper respiratory specimens during the acute phase of infection. The lowest concentration of SARS-CoV-2 viral copies this assay can detect is 138 copies/mL. A negative result does not preclude SARS-Cov-2 infection and should not be used as the sole basis for treatment or other patient management decisions. A negative result may occur with  improper specimen collection/handling, submission of specimen other than nasopharyngeal swab, presence of viral mutation(s) within the areas targeted by this assay, and inadequate number of viral copies(<138 copies/mL). A negative result must be combined with clinical observations, patient history, and epidemiological information. The expected result is Negative.  Fact Sheet for Patients:  BloggerCourse.com  Fact Sheet for Healthcare Providers:  SeriousBroker.it  This  test is no t yet approved or cleared by the [place] and  has been authorized for detection and/or diagnosis of SARS-CoV-2 by FDA under an Emergency Use Authorization (EUA). This EUA will remain  in effect (meaning this test can be used) for the duration of the COVID-19 declaration under Section 564(b)(1) of the Act, 21 U.S.C.section 360bbb-3(b)(1), unless the authorization is terminated  or revoked sooner.       Influenza A by PCR NEGATIVE NEGATIVE Final   Influenza B by PCR NEGATIVE NEGATIVE Final    Comment: (NOTE) The Xpert Xpress SARS-CoV-2/FLU/RSV plus assay is intended as an aid in the diagnosis of influenza from  Nasopharyngeal swab specimens and should not be used as a sole basis for treatment. Nasal washings and aspirates are unacceptable for Xpert Xpress SARS-CoV-2/FLU/RSV testing.  Fact Sheet for Patients: BloggerCourse.com  Fact Sheet for Healthcare Providers: SeriousBroker.it  This test is not yet approved or cleared by the [place] FDA and has been authorized for detection and/or diagnosis of SARS-CoV-2 by FDA under an Emergency Use Authorization (EUA). This EUA will remain in effect (meaning this test can be used) for the duration of the COVID-19 declaration under Section 564(b)(1) of the Act, 21 U.S.C. section 360bbb-3(b)(1), unless the authorization is terminated or revoked.  Performed at [facility], [address], [place], [place] [address]      Discharge Instructions:   Discharge Instructions    Diet general   Complete by: As directed      Allergies as of 10/30/2020      Reactions   Aspirin    Toxic Shock Syndrome   Nsaids    Toxic Shock Syndrome      Medication List    STOP taking these medications   apixaban 5 MG Tabs tablet Commonly known as: ELIQUIS     TAKE these medications   levETIRAcetam 1000 MG tablet Commonly known as: Keppra Take 1 tablet (1,000 mg total) by mouth 2 (two) times daily.   phenytoin 100 MG ER capsule Commonly known as: DILANTIN Take 1 capsule (100 mg total) by mouth daily. What changed: Another medication with the same name was removed. Continue taking this medication, and follow the directions you see here.         Time coordinating discharge: 35 min  Signed:  Joseph Art DO  [facility] 10/30/2020, 1:13 PM

## 2020-10-30 NOTE — Plan of Care (Signed)
  Problem: Education: Goal: Knowledge of General Education information will improve Description: Including pain rating scale, medication(s)/side effects and non-pharmacologic comfort measures Outcome: Completed/Met

## 2020-10-30 NOTE — Progress Notes (Signed)
Patient noted resting quietly though out shift, no seizure activity noted. Patient denied any distress or voices any concerns except he is hungry. Frequent snacks given.

## 2020-11-02 ENCOUNTER — Other Ambulatory Visit: Payer: Self-pay

## 2020-11-02 ENCOUNTER — Emergency Department (HOSPITAL_COMMUNITY)
Admission: EM | Admit: 2020-11-02 | Discharge: 2020-11-02 | Disposition: A | Discharge status: Home / Self Care | Attending: Emergency Medicine | Admitting: Emergency Medicine

## 2020-11-02 DIAGNOSIS — R569 Unspecified convulsions: Secondary | ICD-10-CM | POA: Diagnosis not present

## 2020-11-02 DIAGNOSIS — W458XXA Other foreign body or object entering through skin, initial encounter: Secondary | ICD-10-CM | POA: Diagnosis not present

## 2020-11-02 DIAGNOSIS — S60452A Superficial foreign body of right middle finger, initial encounter: Secondary | ICD-10-CM | POA: Insufficient documentation

## 2020-11-02 LAB — CBC WITH DIFFERENTIAL/PLATELET
Abs Immature Granulocytes: 0.01 10*3/uL (ref 0.00–0.07)
Basophils Absolute: 0 10*3/uL (ref 0.0–0.1)
Basophils Relative: 1 %
Eosinophils Absolute: 0.1 10*3/uL (ref 0.0–0.5)
Eosinophils Relative: 1 %
HCT: 51.5 % (ref 39.0–52.0)
Hemoglobin: 15.5 g/dL (ref 13.0–17.0)
Immature Granulocytes: 0 %
Lymphocytes Relative: 38 %
Lymphs Abs: 2.1 10*3/uL (ref 0.7–4.0)
MCH: 27.3 pg (ref 26.0–34.0)
MCHC: 30.1 g/dL (ref 30.0–36.0)
MCV: 90.8 fL (ref 80.0–100.0)
Monocytes Absolute: 0.3 10*3/uL (ref 0.1–1.0)
Monocytes Relative: 6 %
Neutro Abs: 3.1 10*3/uL (ref 1.7–7.7)
Neutrophils Relative %: 54 %
Platelets: 261 10*3/uL (ref 150–400)
RBC: 5.67 MIL/uL (ref 4.22–5.81)
RDW: 12.9 % (ref 11.5–15.5)
WBC: 5.6 10*3/uL (ref 4.0–10.5)
nRBC: 0 % (ref 0.0–0.2)

## 2020-11-02 LAB — BASIC METABOLIC PANEL
Anion gap: 10 (ref 5–15)
BUN: 7 mg/dL (ref 6–20)
CO2: 22 mmol/L (ref 22–32)
Calcium: 8.8 mg/dL — ABNORMAL LOW (ref 8.9–10.3)
Chloride: 106 mmol/L (ref 98–111)
Creatinine, Ser: 1.13 mg/dL (ref 0.61–1.24)
GFR, Estimated: >60 mL/min (ref >60)
Glucose, Bld: 78 mg/dL (ref 70–99)
Potassium: 4.1 mmol/L (ref 3.5–5.1)
Sodium: 138 mmol/L (ref 135–145)

## 2020-11-02 LAB — PHENYTOIN LEVEL, TOTAL: Phenytoin Lvl: <2.5 ug/mL — ABNORMAL LOW (ref 10.0–20.0)

## 2020-11-02 LAB — MAGNESIUM: Magnesium: 2.3 mg/dL (ref 1.7–2.4)

## 2020-11-02 MED ORDER — LEVETIRACETAM IN NACL 1000 MG/100ML IV SOLN
1000.0000 mg | Freq: Once | INTRAVENOUS | Status: AC
  Administered 2020-11-02: 1000 mg via INTRAVENOUS
  Filled 2020-11-02: qty 100

## 2020-11-02 MED ORDER — LACOSAMIDE 50 MG PO TABS
50.0000 mg | ORAL_TABLET | Freq: Two times a day (BID) | ORAL | 0 refills | Status: DC
Start: 2020-11-02 — End: 2020-11-21

## 2020-11-02 MED ORDER — SODIUM CHLORIDE 0.9 % IV SOLN
200.0000 mg | Freq: Once | INTRAVENOUS | Status: AC
  Administered 2020-11-02: 200 mg via INTRAVENOUS
  Filled 2020-11-02: qty 20

## 2020-11-02 NOTE — Discharge Instructions (Addendum)
Stop your dilantin,start your vimpat. Follow up with your neurologist in the office.

## 2020-11-02 NOTE — ED Provider Notes (Signed)
[facility] EMERGENCY DEPARTMENT Provider Note   CSN: [id number] Arrival date & time: 11/02/20  1040     History Chief Complaint  Patient presents with  . Seizures    John Flynn is a 34 y.o. male.  34 yo M with a chief complaint of seizure activity.  This occurred prior to arrival.  Reportedly had 3 seizures in jail.  The patient is more concerned about pain to his finger.  States that he has had a ring on there for a year and can get it off.  States it is starting to hurt him severely.  Thinks has been compliant with his medications.  The history is provided by the patient, the EMS personnel and the police.  Seizures Seizure activity on arrival: no   Seizure type:  Grand mal Initial focality:  None Episode characteristics: abnormal movements   Return to baseline: yes   Severity:  Moderate Duration:  5 minutes Timing:  Clustered Number of seizures this episode:  3 Progression:  Resolved Recent head injury:  No recent head injuries PTA treatment:  None History of seizures: yes        Past Medical History:  Diagnosis Date  . Blood clot in vein   . Seizures (HCC)    on keppra    Patient Active Problem List   Diagnosis Date Noted  . Seizure (HCC) 10/29/2020  . AKI (acute kidney injury) (HCC) 10/29/2020  . DVT (deep venous thrombosis) (HCC) 10/29/2020    No past surgical history on file.     No family history on file.  Social History   Tobacco Use  . Smoking status: Never Smoker  . Smokeless tobacco: Never Used  Substance Use Topics  . Alcohol use: Yes  . Drug use: Yes    Home Medications Prior to Admission medications   Medication Sig Start Date End Date Taking? Authorizing Provider  albuterol (VENTOLIN HFA) 108 (90 Base) MCG/ACT inhaler Inhale 2 puffs into the lungs 2 (two) times daily as needed for wheezing or shortness of breath.   Yes [provider]  levETIRAcetam (KEPPRA) 1000 MG tablet Take 1 tablet (1,000 mg  total) by mouth 2 (two) times daily. 10/06/20 11/05/20 Yes Trifan, Kermit Balo, MD  phenytoin (DILANTIN) 100 MG ER capsule Take 1 capsule (100 mg total) by mouth daily. Patient taking differently: Take 100 mg by mouth 3 (three) times daily.  10/06/20 11/02/20 Yes Trifan, Kermit Balo, MD  lacosamide (VIMPAT) 50 MG TABS tablet Take 1 tablet (50 mg total) by mouth 2 (two) times daily. 11/02/20   [name], DO    Allergies    Aspirin and Nsaids  Review of Systems   Review of Systems  Constitutional: Negative for chills and fever.  HENT: Negative for congestion and facial swelling.   Eyes: Negative for discharge and visual disturbance.  Respiratory: Negative for shortness of breath.   Cardiovascular: Negative for chest pain and palpitations.  Gastrointestinal: Negative for abdominal pain, diarrhea and vomiting.  Musculoskeletal: Negative for arthralgias and myalgias.  Skin: Negative for color change and rash.  Neurological: Positive for seizures. Negative for tremors, syncope and headaches.  Psychiatric/Behavioral: Negative for confusion and dysphoric mood.    Physical Exam Updated Vital Signs BP 138/84   Pulse 74   Temp 97.9 F (36.6 C)   Resp 18   SpO2 98%   Physical Exam Vitals and nursing note reviewed.  Constitutional:      Appearance: He is well-developed.  HENT:  Head: Normocephalic and atraumatic.  Eyes:     Pupils: Pupils are equal, round, and reactive to light.  Neck:     Vascular: No JVD.  Cardiovascular:     Rate and Rhythm: Normal rate and regular rhythm.     Heart sounds: No murmur heard.  No friction rub. No gallop.   Pulmonary:     Effort: No respiratory distress.     Breath sounds: No wheezing.  Abdominal:     General: There is no distension.     Tenderness: There is no abdominal tenderness. There is no guarding or rebound.  Musculoskeletal:        General: Normal range of motion.     Cervical back: Normal range of motion and neck supple.     Comments:  Pain and swelling to his right middle finger with ring with break in the skin around.   Skin:    Coloration: Skin is not pale.     Findings: No rash.  Neurological:     Mental Status: He is alert and oriented to person, place, and time.  Psychiatric:        Behavior: Behavior normal.     ED Results / Procedures / Treatments   Labs (all labs ordered are listed, but only abnormal results are displayed) Labs Reviewed  BASIC METABOLIC PANEL - Abnormal; Notable for the following components:      Result Value   Calcium 8.8 (*)    All other components within normal limits  PHENYTOIN LEVEL, TOTAL - Abnormal; Notable for the following components:   Phenytoin Lvl <2.5 (*)    All other components within normal limits  CBC WITH DIFFERENTIAL/PLATELET  MAGNESIUM    EKG None  Radiology No results found.  Procedures .Foreign Body Removal  Date/Time: 11/02/2020 12:27 PM Performed by: [name], DO Authorized by: [name], DO  Body area: skin General location: upper extremity Location details: right ring finger  Sedation: Patient sedated: no  Patient restrained: no Patient cooperative: yes Localization method: visualized Removal mechanism: ring cutter. Dressing: antibiotic ointment and dressing applied Tendon involvement: none Depth: subcutaneous Complexity: simple 2 objects recovered. Objects recovered: rings removed, partially embedded in skin Post-procedure assessment: foreign body removed Patient tolerance: patient tolerated the procedure well with no immediate complications   (including critical care time)  Medications Ordered in ED Medications  levETIRAcetam (KEPPRA) IVPB 1000 mg/100 mL premix (0 mg Intravenous Stopped 11/02/20 1242)  levETIRAcetam (KEPPRA) IVPB 1000 mg/100 mL premix (0 mg Intravenous Stopped 11/02/20 1423)  lacosamide (VIMPAT) 200 mg in sodium chloride 0.9 % 25 mL IVPB (0 mg Intravenous Stopped 11/02/20 1502)    ED Course  I have reviewed the  triage vital signs and the nursing notes.  Pertinent labs & imaging results that were available during my care of the patient were reviewed by me and considered in my medical decision making (see chart for details).    MDM Rules/Calculators/A&P                          34 yo M with a chief complaint of seizure-like activity.  Patient has a history of seizures and is on Dilantin and Keppra for this.  Was in the hospital somewhat recently and was seen and found to have a DVT.  Was started on Xarelto.  Patient complaining mostly of pain to his fingers where his rings have been embedded for some time.  These were removed at bedside.  No seizure activity here.  His Dilantin level was unmeasurably low.  I discussed the case with Dr. Jerrell Belfast, neurology.  He felt that Dilantin was not a good choice with him being on Xarelto and recommended changing it to Vimpat.  Recommended 2 g Keppra load here and a 200 mg Vimpat load and then starting on 50 mg twice daily of Vimpat and continuing his home 1000 mg twice daily of Keppra.  No continued seizure activity.  D/c home.   3:11 PM:  I have discussed the diagnosis/risks/treatment options with the patient and believe the pt to be eligible for discharge home to follow-up with PCP, neuro. We also discussed returning to the ED immediately if new or worsening sx occur. We discussed the sx which are most concerning (e.g., sudden worsening pain, fever, inability to tolerate by mouth) that necessitate immediate return. Medications administered to the patient during their visit and any new prescriptions provided to the patient are listed below.  Medications given during this visit Medications  levETIRAcetam (KEPPRA) IVPB 1000 mg/100 mL premix (0 mg Intravenous Stopped 11/02/20 1242)  levETIRAcetam (KEPPRA) IVPB 1000 mg/100 mL premix (0 mg Intravenous Stopped 11/02/20 1423)  lacosamide (VIMPAT) 200 mg in sodium chloride 0.9 % 25 mL IVPB (0 mg Intravenous Stopped 11/02/20 1502)      The patient appears reasonably screen and/or stabilized for discharge and I doubt any other medical condition or other [facility] requiring further screening, evaluation, or treatment in the ED at this time prior to discharge.   Final Clinical Impression(s) / ED Diagnoses Final diagnoses:  Seizure [facility])    Rx / DC Orders ED Discharge Orders         Ordered    lacosamide (VIMPAT) 50 MG TABS tablet  2 times daily        11/02/20 1247           [name], DO 11/02/20 1511

## 2020-11-02 NOTE — ED Notes (Signed)
Patient verbalized understanding of discharge instructions. Opportunity for questions and answers.  

## 2020-11-02 NOTE — ED Triage Notes (Signed)
Pt bib ems from jail with reports of 3 seizures each lasting approx 45seconds this morning. Pt was in bed when the seizures happened. Pt with hx of the same. Pt also c/o pain to R hand where ring is stuck on his finger. Pt with 7/10 headache.  142/100 CBG 116 RR 16 99% RA HR 68

## 2020-11-17 ENCOUNTER — Other Ambulatory Visit: Payer: Self-pay

## 2020-11-17 ENCOUNTER — Emergency Department (HOSPITAL_COMMUNITY)

## 2020-11-17 ENCOUNTER — Emergency Department (HOSPITAL_COMMUNITY)
Admission: EM | Admit: 2020-11-17 | Discharge: 2020-11-17 | Disposition: A | Discharge status: Home / Self Care | Source: Home / Self Care | Attending: Emergency Medicine | Admitting: Emergency Medicine

## 2020-11-17 ENCOUNTER — Encounter (HOSPITAL_COMMUNITY): Payer: Self-pay | Admitting: *Deleted

## 2020-11-17 DIAGNOSIS — R4182 Altered mental status, unspecified: Secondary | ICD-10-CM | POA: Insufficient documentation

## 2020-11-17 DIAGNOSIS — R569 Unspecified convulsions: Secondary | ICD-10-CM

## 2020-11-17 DIAGNOSIS — F17200 Nicotine dependence, unspecified, uncomplicated: Secondary | ICD-10-CM | POA: Insufficient documentation

## 2020-11-17 DIAGNOSIS — G40901 Epilepsy, unspecified, not intractable, with status epilepticus: Secondary | ICD-10-CM | POA: Diagnosis not present

## 2020-11-17 HISTORY — DX: Other pulmonary embolism without acute cor pulmonale: I26.99

## 2020-11-17 LAB — CBC WITH DIFFERENTIAL/PLATELET
Abs Immature Granulocytes: 0.05 10*3/uL (ref 0.00–0.07)
Basophils Absolute: 0 10*3/uL (ref 0.0–0.1)
Basophils Relative: 1 %
Eosinophils Absolute: 0.1 10*3/uL (ref 0.0–0.5)
Eosinophils Relative: 2 %
HCT: 46.7 % (ref 39.0–52.0)
Hemoglobin: 13.8 g/dL (ref 13.0–17.0)
Immature Granulocytes: 1 %
Lymphocytes Relative: 31 %
Lymphs Abs: 2.2 10*3/uL (ref 0.7–4.0)
MCH: 27.2 pg (ref 26.0–34.0)
MCHC: 29.6 g/dL — ABNORMAL LOW (ref 30.0–36.0)
MCV: 91.9 fL (ref 80.0–100.0)
Monocytes Absolute: 0.6 10*3/uL (ref 0.1–1.0)
Monocytes Relative: 8 %
Neutro Abs: 4.1 10*3/uL (ref 1.7–7.7)
Neutrophils Relative %: 57 %
Platelets: 221 10*3/uL (ref 150–400)
RBC: 5.08 MIL/uL (ref 4.22–5.81)
RDW: 13.2 % (ref 11.5–15.5)
WBC: 7.1 10*3/uL (ref 4.0–10.5)
nRBC: 0.3 % — ABNORMAL HIGH (ref 0.0–0.2)

## 2020-11-17 LAB — BASIC METABOLIC PANEL
Anion gap: 12 (ref 5–15)
BUN: 8 mg/dL (ref 6–20)
CO2: 26 mmol/L (ref 22–32)
Calcium: 9 mg/dL (ref 8.9–10.3)
Chloride: 103 mmol/L (ref 98–111)
Creatinine, Ser: 0.97 mg/dL (ref 0.61–1.24)
GFR, Estimated: >60 mL/min (ref >60)
Glucose, Bld: 91 mg/dL (ref 70–99)
Potassium: 3.6 mmol/L (ref 3.5–5.1)
Sodium: 141 mmol/L (ref 135–145)

## 2020-11-17 MED ORDER — LEVETIRACETAM 1000 MG PO TABS: 1000.0000 mg | ORAL_TABLET | Freq: Two times a day (BID) | ORAL | 2 refills | Status: DC

## 2020-11-17 MED ORDER — LEVETIRACETAM IN NACL 1000 MG/100ML IV SOLN
1000.0000 mg | Freq: Once | INTRAVENOUS | Status: AC
  Administered 2020-11-17: 1000 mg via INTRAVENOUS
  Filled 2020-11-17: qty 100
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
## 2020-11-17 NOTE — ED Triage Notes (Signed)
Pt arrived via EMS from jail with [employer] [facility].  Pt has history of seizures and is on Eliquis as well. Pt had a witnessed seizure by the jail guard, then about 8 more seizures per EMS.  EMS administered Versed 5mg  IM.  Pt did fall with seizure and hit head.  When he came to at jail he did tell them that his head and neck hurt.  Pt is in c-collar. CBG 146. 140/90, p-94

## 2020-11-19 ENCOUNTER — Emergency Department (HOSPITAL_COMMUNITY)

## 2020-11-19 ENCOUNTER — Encounter (HOSPITAL_COMMUNITY): Payer: Self-pay | Admitting: Family Medicine

## 2020-11-19 ENCOUNTER — Inpatient Hospital Stay (HOSPITAL_COMMUNITY)
Admission: EM | Admit: 2020-11-19 | Discharge: 2020-11-21 | DRG: 101 | Attending: Internal Medicine | Admitting: Internal Medicine

## 2020-11-19 DIAGNOSIS — G40901 Epilepsy, unspecified, not intractable, with status epilepticus: Principal | ICD-10-CM

## 2020-11-19 DIAGNOSIS — F1410 Cocaine abuse, uncomplicated: Secondary | ICD-10-CM | POA: Diagnosis present

## 2020-11-19 DIAGNOSIS — R471 Dysarthria and anarthria: Secondary | ICD-10-CM | POA: Diagnosis present

## 2020-11-19 DIAGNOSIS — F4310 Post-traumatic stress disorder, unspecified: Secondary | ICD-10-CM | POA: Diagnosis present

## 2020-11-19 DIAGNOSIS — Z86718 Personal history of other venous thrombosis and embolism: Secondary | ICD-10-CM

## 2020-11-19 DIAGNOSIS — F514 Sleep terrors [night terrors]: Secondary | ICD-10-CM | POA: Diagnosis present

## 2020-11-19 DIAGNOSIS — Z9114 Patient's other noncompliance with medication regimen: Secondary | ICD-10-CM | POA: Diagnosis not present

## 2020-11-19 DIAGNOSIS — Z886 Allergy status to analgesic agent status: Secondary | ICD-10-CM | POA: Diagnosis not present

## 2020-11-19 DIAGNOSIS — Z91012 Allergy to eggs: Secondary | ICD-10-CM | POA: Diagnosis not present

## 2020-11-19 DIAGNOSIS — Z86711 Personal history of pulmonary embolism: Secondary | ICD-10-CM | POA: Diagnosis not present

## 2020-11-19 DIAGNOSIS — Z79899 Other long term (current) drug therapy: Secondary | ICD-10-CM | POA: Diagnosis not present

## 2020-11-19 DIAGNOSIS — F17200 Nicotine dependence, unspecified, uncomplicated: Secondary | ICD-10-CM | POA: Diagnosis present

## 2020-11-19 DIAGNOSIS — Z20822 Contact with and (suspected) exposure to covid-19: Secondary | ICD-10-CM | POA: Diagnosis present

## 2020-11-19 DIAGNOSIS — Z7901 Long term (current) use of anticoagulants: Secondary | ICD-10-CM | POA: Diagnosis not present

## 2020-11-19 DIAGNOSIS — R569 Unspecified convulsions: Secondary | ICD-10-CM | POA: Diagnosis not present

## 2020-11-19 DIAGNOSIS — F5104 Psychophysiologic insomnia: Secondary | ICD-10-CM | POA: Diagnosis present

## 2020-11-19 LAB — CBC WITH DIFFERENTIAL/PLATELET
Abs Immature Granulocytes: 0.05 10*3/uL (ref 0.00–0.07)
Basophils Absolute: 0 10*3/uL (ref 0.0–0.1)
Basophils Relative: 1 %
Eosinophils Absolute: 0.1 10*3/uL (ref 0.0–0.5)
Eosinophils Relative: 1 %
HCT: 45.4 % (ref 39.0–52.0)
Hemoglobin: 13.9 g/dL (ref 13.0–17.0)
Immature Granulocytes: 1 %
Lymphocytes Relative: 30 %
Lymphs Abs: 1.7 10*3/uL (ref 0.7–4.0)
MCH: 27 pg (ref 26.0–34.0)
MCHC: 30.6 g/dL (ref 30.0–36.0)
MCV: 88.3 fL (ref 80.0–100.0)
Monocytes Absolute: 0.4 10*3/uL (ref 0.1–1.0)
Monocytes Relative: 7 %
Neutro Abs: 3.4 10*3/uL (ref 1.7–7.7)
Neutrophils Relative %: 60 %
Platelets: 237 10*3/uL (ref 150–400)
RBC: 5.14 MIL/uL (ref 4.22–5.81)
RDW: 13 % (ref 11.5–15.5)
WBC: 5.7 10*3/uL (ref 4.0–10.5)
nRBC: 0 % (ref 0.0–0.2)

## 2020-11-19 LAB — RESP PANEL BY RT-PCR (FLU A&B, COVID) ARPGX2
Influenza A by PCR: NEGATIVE
Influenza B by PCR: NEGATIVE
SARS Coronavirus 2 by RT PCR: NEGATIVE

## 2020-11-19 LAB — COMPREHENSIVE METABOLIC PANEL
ALT: 36 U/L (ref 0–44)
AST: 20 U/L (ref 15–41)
Albumin: 3.2 g/dL — ABNORMAL LOW (ref 3.5–5.0)
Alkaline Phosphatase: 48 U/L (ref 38–126)
Anion gap: 8 (ref 5–15)
BUN: 9 mg/dL (ref 6–20)
CO2: 26 mmol/L (ref 22–32)
Calcium: 8.8 mg/dL — ABNORMAL LOW (ref 8.9–10.3)
Chloride: 104 mmol/L (ref 98–111)
Creatinine, Ser: 0.9 mg/dL (ref 0.61–1.24)
GFR, Estimated: >60 mL/min (ref >60)
Glucose, Bld: 111 mg/dL — ABNORMAL HIGH (ref 70–99)
Potassium: 4.1 mmol/L (ref 3.5–5.1)
Sodium: 138 mmol/L (ref 135–145)
Total Bilirubin: 0.3 mg/dL (ref 0.3–1.2)
Total Protein: 6.2 g/dL — ABNORMAL LOW (ref 6.5–8.1)

## 2020-11-19 LAB — PHENYTOIN LEVEL, TOTAL: Phenytoin Lvl: <2.5 ug/mL — ABNORMAL LOW (ref 10.0–20.0)

## 2020-11-19 LAB — ETHANOL: Alcohol, Ethyl (B): <10 mg/dL (ref <10)

## 2020-11-19 MED ORDER — LACTATED RINGERS IV SOLN: INTRAVENOUS | Status: DC

## 2020-11-19 MED ORDER — ALBUTEROL SULFATE HFA 108 (90 BASE) MCG/ACT IN AERS
2.0000 | INHALATION_SPRAY | Freq: Two times a day (BID) | RESPIRATORY_TRACT | Status: DC | PRN
  Filled 2020-11-19: qty 6.7

## 2020-11-19 MED ORDER — SODIUM CHLORIDE 0.9 % IV SOLN
100.0000 mg | Freq: Two times a day (BID) | INTRAVENOUS | Status: DC
  Administered 2020-11-19 – 2020-11-20 (×2): 100 mg via INTRAVENOUS
  Filled 2020-11-19 (×4): qty 10

## 2020-11-19 MED ORDER — ACETAMINOPHEN 325 MG PO TABS: 650.0000 mg | ORAL_TABLET | Freq: Four times a day (QID) | ORAL | Status: DC | PRN

## 2020-11-19 MED ORDER — HYDROCODONE-ACETAMINOPHEN 5-325 MG PO TABS: 1.0000 | ORAL_TABLET | Freq: Four times a day (QID) | ORAL | Status: DC | PRN

## 2020-11-19 MED ORDER — SODIUM CHLORIDE 0.9 % IV SOLN
4000.0000 mg | Freq: Once | INTRAVENOUS | Status: AC
  Administered 2020-11-19: 4000 mg via INTRAVENOUS
  Filled 2020-11-19: qty 40

## 2020-11-19 MED ORDER — ONDANSETRON HCL 4 MG/2ML IJ SOLN: 4.0000 mg | Freq: Four times a day (QID) | INTRAMUSCULAR | Status: DC | PRN

## 2020-11-19 MED ORDER — SODIUM CHLORIDE 0.9 % IV BOLUS
1000.0000 mL | Freq: Once | INTRAVENOUS | Status: AC
  Administered 2020-11-19: 1000 mL via INTRAVENOUS

## 2020-11-19 MED ORDER — ACETAMINOPHEN 650 MG RE SUPP: 650.0000 mg | Freq: Four times a day (QID) | RECTAL | Status: DC | PRN

## 2020-11-19 MED ORDER — ONDANSETRON HCL 4 MG PO TABS: 4.0000 mg | ORAL_TABLET | Freq: Four times a day (QID) | ORAL | Status: DC | PRN

## 2020-11-19 MED ORDER — LORAZEPAM 2 MG/ML IJ SOLN
INTRAMUSCULAR | Status: AC
  Administered 2020-11-19: 2 mg via INTRAVENOUS
  Filled 2020-11-19: qty 1

## 2020-11-19 MED ORDER — LEVETIRACETAM IN NACL 1000 MG/100ML IV SOLN
1000.0000 mg | Freq: Two times a day (BID) | INTRAVENOUS | Status: DC
  Administered 2020-11-20: 1000 mg via INTRAVENOUS
  Filled 2020-11-19: qty 100

## 2020-11-19 MED ORDER — LORAZEPAM 2 MG/ML IJ SOLN: 2.0000 mg | Freq: Once | INTRAMUSCULAR | Status: AC

## 2020-11-19 MED ORDER — LEVETIRACETAM IN NACL 1500 MG/100ML IV SOLN
1500.0000 mg | Freq: Once | INTRAVENOUS | Status: DC
  Filled 2020-11-19: qty 100

## 2020-11-19 MED ORDER — SENNOSIDES-DOCUSATE SODIUM 8.6-50 MG PO TABS: 1.0000 | ORAL_TABLET | Freq: Every evening | ORAL | Status: DC | PRN

## 2020-11-19 MED ORDER — APIXABAN 5 MG PO TABS
5.0000 mg | ORAL_TABLET | Freq: Two times a day (BID) | ORAL | Status: DC
  Administered 2020-11-20 – 2020-11-21 (×3): 5 mg via ORAL
  Filled 2020-11-19 (×3): qty 1

## 2020-11-19 NOTE — ED Notes (Signed)
Patient transported to CT 
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
## 2020-11-19 NOTE — ED Triage Notes (Signed)
Pt arrives to ED via [facility] from jail in custody and cuffed to bed. He comes today with seizures he had 3 had jail and one with ems. Ems gave pt 5  Mg versed. Pt arrives to ED alert and ox4. No obvious injury noted.

## 2020-11-20 ENCOUNTER — Inpatient Hospital Stay (HOSPITAL_COMMUNITY)

## 2020-11-20 DIAGNOSIS — G40901 Epilepsy, unspecified, not intractable, with status epilepticus: Principal | ICD-10-CM

## 2020-11-20 LAB — BASIC METABOLIC PANEL
Anion gap: 9 (ref 5–15)
BUN: 8 mg/dL (ref 6–20)
CO2: 25 mmol/L (ref 22–32)
Calcium: 8.5 mg/dL — ABNORMAL LOW (ref 8.9–10.3)
Chloride: 107 mmol/L (ref 98–111)
Creatinine, Ser: 0.96 mg/dL (ref 0.61–1.24)
GFR, Estimated: >60 mL/min (ref >60)
Glucose, Bld: 105 mg/dL — ABNORMAL HIGH (ref 70–99)
Potassium: 3.7 mmol/L (ref 3.5–5.1)
Sodium: 141 mmol/L (ref 135–145)

## 2020-11-20 LAB — CBC
HCT: 45.2 % (ref 39.0–52.0)
Hemoglobin: 13.8 g/dL (ref 13.0–17.0)
MCH: 27.3 pg (ref 26.0–34.0)
MCHC: 30.5 g/dL (ref 30.0–36.0)
MCV: 89.5 fL (ref 80.0–100.0)
Platelets: 247 10*3/uL (ref 150–400)
RBC: 5.05 MIL/uL (ref 4.22–5.81)
RDW: 13.1 % (ref 11.5–15.5)
WBC: 7.9 10*3/uL (ref 4.0–10.5)
nRBC: 0 % (ref 0.0–0.2)

## 2020-11-20 MED ORDER — LEVETIRACETAM IN NACL 1500 MG/100ML IV SOLN: 1500.0000 mg | Freq: Two times a day (BID) | INTRAVENOUS | Status: DC

## 2020-11-20 MED ORDER — LEVETIRACETAM 750 MG PO TABS
1500.0000 mg | ORAL_TABLET | Freq: Two times a day (BID) | ORAL | Status: DC
  Administered 2020-11-20 – 2020-11-21 (×2): 1500 mg via ORAL
  Filled 2020-11-20 (×2): qty 2

## 2020-11-20 MED ORDER — LORAZEPAM 2 MG/ML IJ SOLN: 2.0000 mg | INTRAMUSCULAR | Status: DC | PRN

## 2020-11-20 MED ORDER — LACOSAMIDE 50 MG PO TABS
100.0000 mg | ORAL_TABLET | Freq: Two times a day (BID) | ORAL | Status: DC
  Administered 2020-11-20 – 2020-11-21 (×2): 100 mg via ORAL
  Filled 2020-11-20 (×3): qty 2

## 2020-11-20 NOTE — Procedures (Signed)
Patient Name: John Flynn  MRN: [id number]  Epilepsy Attending: Charlsie Quest  Referring Physician/Provider: Dr Trey Paula Date: 11/20/2020  Duration: 30.41 mins  Patient history: 34 year old male with a history of epilepsy, arriving from jail in status epilepticus. EEG to evaluate for seizure.   Level of alertness: Awake, drowsy, sleep, comatose, lethargic  AEDs during EEG study: LV, LCM  Technical aspects: This EEG study was done with scalp electrodes positioned according to the 10-20 International system of electrode placement. Electrical activity was acquired at a sampling rate of 500Hz  and reviewed with a high frequency filter of 70Hz  and a low frequency filter of 1Hz . EEG data were recorded continuously and digitally stored.   Description: The posterior dominant rhythm consists of 9 Hz activity of moderate voltage (25-35 uV) seen predominantly in posterior head regions, symmetric and reactive to eye opening and eye closing. There is an excessive amount of 15 to 18 Hz beta activity distributed symmetrically and diffusely. Hyperventilation and photic stimulation were not performed.  Patient event was recorded at 857 during which patient was noted to have eye fluttering, chest jerking, not responding, left index finger flexion extension movement.  Concomitant EEG before, during and after the event did not show any EEG change to suggest seizure.  ABNORMALITY -Excessive beta, generalized  IMPRESSION: This study is within normal limits. No seizures or epileptiform discharges were seen throughout the recording. The excessive beta activity seen in the background is most likely due to the effect of benzodiazepine and is a benign EEG pattern.  One event was recorded at 857 as described above without concomitant EEG change and was a Non epileptic event.   Manika Hast 

## 2020-11-20 NOTE — Assessment & Plan Note (Signed)
-   Continue Eliquis 

## 2020-11-20 NOTE — Hospital Course (Addendum)
John Flynn is a 34 y.o. male with medical history significant for  DVT on Eliquis and seizure disorder presenting by EMS with seizure activity while in jail.  It is reported he had multiple episodes of seizure activity prior to admission.  Facility reports he had 4 seizures lasting for 3 to 4 minutes each and they were tonic-clonic.  He believes he bit his tongue and urinated on himself. He does not remember any inciting events for the seizures.  He states just prior to receiving his dinner tray he felt some pain in his mouth and had a headache and felt that he could not open his mouth.   Guards state that he then had a seizure and he was lowered to the ground.  Did not hit his head.  They report he had 2 more episodes of seizure activity en route. He did bite his tongue and was incontinent of urine during these seizures. He states he also had multiple seizures Friday and was seen in the Er and discharged back to jail. He reportedly takes Keppra.  He states his been taking it in jail but his medication of Vimpat was stopped a few months ago by himself. He states he was self medicating at that time. Vimpat was not resumed when he went to jail as they did not have the medication.  He was loaded with Keppra in the ER and resumed back on Vimpat in addition to Keppra.  He had a breakthrough seizure the morning of 11/20/2020 lasting approximately 3 minutes which resolved prior to Ativan needing to be administered.  This was witnessed by his guards as well.  He was postictal after. Neurology was consulted and he did undergo an EEG on admission which was negative for seizure activity.  He was followed by neurology and his regimen was adjusted as needed.  Prior to discharge, he was recommended to continue on Keppra 750 mg twice daily and Vimpat 200 mg twice daily.  He was able to obtain a Vimpat prescription from the [facility] pharmacy however will need further assistance with future refills.  If this is unable to be  obtained in the future, he should try and discussed this with neurology in order to transition to a more affordable medication regimen.

## 2020-11-20 NOTE — Treatment Plan (Signed)
Treatment Plan  Mr. Faraone's EEG does not show indication of ongoing seizures. However, had witnessed breakthrough seizure earlier today (3 minutes duration). Therefore, will escalate keppra to 1500 mg bid. Continue lacosamide at the same dose for now. No indication of ongoing status epilepticus. Will reassess tomorrow.  May convert both keppra and vimpat to oral dosing at the same daily mg dose and schedule. Will re-assess tomorrow.  Meredeth Ide, MD

## 2020-11-20 NOTE — Assessment & Plan Note (Addendum)
-   finally resolved on admission; EEG negative for seizure activity -Neurology consulted, appreciate assistance - needs to have outpatient evaluation with neuro for ongoing care - discharge meds: keppra 750 mg BID and vimpat 200 mg BID; concerned about affordability of vimpat in future especially if jail cannot provide

## 2020-11-20 NOTE — Progress Notes (Signed)
PROGRESS NOTE    John Flynn   DXI:[id number]  DOB: 1986/09/12  DOA: 11/19/2020     1  PCP: Patient, No Pcp Per  CC: witnessed seizures  Hospital Course: John Flynn is a 34 y.o. male with medical history significant for  DVT on Eliquis and seizure disorder presenting by EMS with seizure activity while in jail.  It is reported he had multiple episodes of seizure activity prior to admission.  Facility reports he had 4 seizures lasting for 3 to 4 minutes each and they were tonic-clonic.  He believes he bit his tongue and urinated on himself. He does not remember any inciting events for the seizures.  He states just prior to receiving his dinner tray he felt some pain in his mouth and had a headache and felt that he could not open his mouth.   Guards state that he then had a seizure and he was lowered to the ground.  Did not hit his head.  They report he had 2 more episodes of seizure activity en route. He did bite his tongue and was incontinent of urine during these seizures. He states he also had multiple seizures Friday and was seen in the Er and discharged back to jail. He reportedly takes Keppra.  He states his been taking it in jail but his medication of Vimpat was stopped a few months ago by himself. He states he was self medicating at that time. Vimpat was not resumed when he went to jail as they did not have the medication.  He was loaded with Keppra in the ER and resumed back on Vimpat in addition to Keppra.  He had a breakthrough seizure the morning of 11/20/2020 lasting approximately 3 minutes which resolved prior to Ativan needing to be administered.  This was witnessed by his guards as well.  He was postictal after. Neurology was consulted and he did undergo an EEG on admission which was negative for seizure activity however his Keppra dose was also increased further to 1500 mg twice daily and continued on Vimpat 100 mg twice daily.   Interval History:  Seen this am in  the ER. He had just had another breakthru seizure lasting 3 minutes. No ativan given. Witnessed by staff and guards bedside.  EEG was negative when performed.  Neurology increased to Keppra.  He was postictal when I saw him shortly after the seizure.  Old records reviewed in assessment of this patient  ROS: Review of systems not obtained due to patient factors. Patient confused and post ictal  Assessment & Plan: * Status epilepticus (HCC) - finally resolved on admission; EEG negative for seizure activity -Neurology consulted, appreciate assistance -Keppra increased, continue on 1500 mg twice daily -Continue Vimpat 100 mg twice daily -Continue on neurochecks.  Patient also has guards bedside as good witnesses  History of DVT (deep vein thrombosis) -Continue Eliquis   Antimicrobials:   DVT prophylaxis: SCD Code Status: Full Family Communication: none present Disposition Plan: Status is: Inpatient  Remains inpatient appropriate because:Ongoing diagnostic testing needed not appropriate for outpatient work up, Unsafe d/c plan, IV treatments appropriate due to intensity of illness or inability to take PO and Inpatient level of care appropriate due to severity of illness   Dispo: The patient is from: Home/jail              Anticipated d/c is to: back to jail              Anticipated d/c date is: 1  day              Patient currently is not medically stable to d/c.       Objective: Blood pressure (!) 128/91, pulse 80, temperature 98.3 F (36.8 C), resp. rate 18, SpO2 95 %.  Examination: General appearance: Slowed mentation, slightly confused and unable to answer orientation questions fully but in no obvious distress with guards bedside Head: Normocephalic, without obvious abnormality, atraumatic Eyes: EOMI, PERRL Lungs: clear to auscultation bilaterally Heart: regular rate and rhythm and S1, S2 normal Abdomen: normal findings: bowel sounds normal and soft,  non-tender Extremities: No edema Skin: mobility and turgor normal Neurologic: slowed mentation but strength intact  Consultants:   Neuro  Procedures:   EEG  Data Reviewed: I have personally reviewed following labs and imaging studies Results for orders placed or performed during the hospital encounter of 11/19/20 (from the past 24 hour(s))  CBC with Differential/Platelet     Status: None   Collection Time: 11/19/20  7:47 PM  Result Value Ref Range   WBC 5.7 4.0 - 10.5 K/uL   RBC 5.14 4.22 - 5.81 MIL/uL   Hemoglobin 13.9 13.0 - 17.0 g/dL   HCT 37.1 06.2 - 69.4 %   MCV 88.3 80.0 - 100.0 fL   MCH 27.0 26.0 - 34.0 pg   MCHC 30.6 30.0 - 36.0 g/dL   RDW 85.4 62.7 - 03.5 %   Platelets 237 150 - 400 K/uL   nRBC 0.0 0.0 - 0.2 %   Neutrophils Relative % 60 %   Neutro Abs 3.4 1.7 - 7.7 K/uL   Lymphocytes Relative 30 %   Lymphs Abs 1.7 0.7 - 4.0 K/uL   Monocytes Relative 7 %   Monocytes Absolute 0.4 0.1 - 1.0 K/uL   Eosinophils Relative 1 %   Eosinophils Absolute 0.1 0.0 - 0.5 K/uL   Basophils Relative 1 %   Basophils Absolute 0.0 0.0 - 0.1 K/uL   Immature Granulocytes 1 %   Abs Immature Granulocytes 0.05 0.00 - 0.07 K/uL  Comprehensive metabolic panel     Status: Abnormal   Collection Time: 11/19/20  7:47 PM  Result Value Ref Range   Sodium 138 135 - 145 mmol/L   Potassium 4.1 3.5 - 5.1 mmol/L   Chloride 104 98 - 111 mmol/L   CO2 26 22 - 32 mmol/L   Glucose, Bld 111 (H) 70 - 99 mg/dL   BUN 9 6 - 20 mg/dL   Creatinine, Ser 0.09 0.61 - 1.24 mg/dL   Calcium 8.8 (L) 8.9 - 10.3 mg/dL   Total Protein 6.2 (L) 6.5 - 8.1 g/dL   Albumin 3.2 (L) 3.5 - 5.0 g/dL   AST 20 15 - 41 U/L   ALT 36 0 - 44 U/L   Alkaline Phosphatase 48 38 - 126 U/L   Total Bilirubin 0.3 0.3 - 1.2 mg/dL   GFR, Estimated >38 >18 mL/min   Anion gap 8 5 - 15  Ethanol     Status: None   Collection Time: 11/19/20  7:47 PM  Result Value Ref Range   Alcohol, Ethyl (B) <10 <10 mg/dL  Phenytoin level, total      Status: Abnormal   Collection Time: 11/19/20  7:47 PM  Result Value Ref Range   Phenytoin Lvl <2.5 (L) 10.0 - 20.0 ug/mL  Resp Panel by RT-PCR (Flu A&B, Covid) Nasopharyngeal Swab     Status: None   Collection Time: 11/19/20  9:35 PM   Specimen: Nasopharyngeal  Swab; Nasopharyngeal(NP) swabs in vial transport medium  Result Value Ref Range   SARS Coronavirus 2 by RT PCR NEGATIVE NEGATIVE   Influenza A by PCR NEGATIVE NEGATIVE   Influenza B by PCR NEGATIVE NEGATIVE  Basic metabolic panel     Status: Abnormal   Collection Time: 11/20/20  3:33 AM  Result Value Ref Range   Sodium 141 135 - 145 mmol/L   Potassium 3.7 3.5 - 5.1 mmol/L   Chloride 107 98 - 111 mmol/L   CO2 25 22 - 32 mmol/L   Glucose, Bld 105 (H) 70 - 99 mg/dL   BUN 8 6 - 20 mg/dL   Creatinine, Ser 9.21 0.61 - 1.24 mg/dL   Calcium 8.5 (L) 8.9 - 10.3 mg/dL   GFR, Estimated >19 >41 mL/min   Anion gap 9 5 - 15  CBC     Status: None   Collection Time: 11/20/20  3:33 AM  Result Value Ref Range   WBC 7.9 4.0 - 10.5 K/uL   RBC 5.05 4.22 - 5.81 MIL/uL   Hemoglobin 13.8 13.0 - 17.0 g/dL   HCT 74.0 81.4 - 48.1 %   MCV 89.5 80.0 - 100.0 fL   MCH 27.3 26.0 - 34.0 pg   MCHC 30.5 30.0 - 36.0 g/dL   RDW 85.6 31.4 - 97.0 %   Platelets 247 150 - 400 K/uL   nRBC 0.0 0.0 - 0.2 %    Recent Results (from the past 240 hour(s))  Resp Panel by RT-PCR (Flu A&B, Covid) Nasopharyngeal Swab     Status: None   Collection Time: 11/19/20  9:35 PM   Specimen: Nasopharyngeal Swab; Nasopharyngeal(NP) swabs in vial transport medium  Result Value Ref Range Status   SARS Coronavirus 2 by RT PCR NEGATIVE NEGATIVE Final    Comment: (NOTE) SARS-CoV-2 target nucleic acids are NOT DETECTED.  The SARS-CoV-2 RNA is generally detectable in upper respiratory specimens during the acute phase of infection. The lowest concentration of SARS-CoV-2 viral copies this assay can detect is 138 copies/mL. A negative result does not preclude SARS-Cov-2 infection  and should not be used as the sole basis for treatment or other patient management decisions. A negative result may occur with  improper specimen collection/handling, submission of specimen other than nasopharyngeal swab, presence of viral mutation(s) within the areas targeted by this assay, and inadequate number of viral copies(<138 copies/mL). A negative result must be combined with clinical observations, patient history, and epidemiological information. The expected result is Negative.  Fact Sheet for Patients:  BloggerCourse.com  Fact Sheet for Healthcare Providers:  SeriousBroker.it  This test is no t yet approved or cleared by the [place] FDA and  has been authorized for detection and/or diagnosis of SARS-CoV-2 by FDA under an Emergency Use Authorization (EUA). This EUA will remain  in effect (meaning this test can be used) for the duration of the COVID-19 declaration under Section 564(b)(1) of the Act, 21 U.S.C.section 360bbb-3(b)(1), unless the authorization is terminated  or revoked sooner.       Influenza A by PCR NEGATIVE NEGATIVE Final   Influenza B by PCR NEGATIVE NEGATIVE Final    Comment: (NOTE) The Xpert Xpress SARS-CoV-2/FLU/RSV plus assay is intended as an aid in the diagnosis of influenza from Nasopharyngeal swab specimens and should not be used as a sole basis for treatment. Nasal washings and aspirates are unacceptable for Xpert Xpress SARS-CoV-2/FLU/RSV testing.  Fact Sheet for Patients: BloggerCourse.com  Fact Sheet for Healthcare Providers: SeriousBroker.it  This  test is not yet approved or cleared by the [place] FDA and has been authorized for detection and/or diagnosis of SARS-CoV-2 by FDA under an Emergency Use Authorization (EUA). This EUA will remain in effect (meaning this test can be used) for the duration of the COVID-19 declaration  under Section 564(b)(1) of the Act, 21 U.S.C. section 360bbb-3(b)(1), unless the authorization is terminated or revoked.  Performed at [facility], [address] [place], [place] [address]      Radiology Studies: CT Head Wo Contrast  Result Date: 11/19/2020 CLINICAL DATA:  Seizure. EXAM: CT HEAD WITHOUT CONTRAST TECHNIQUE: Contiguous axial images were obtained from the base of the skull through the vertex without intravenous contrast. COMPARISON:  November 17, 2020 FINDINGS: Brain: No evidence of acute infarction, hemorrhage, hydrocephalus, extra-axial collection or mass lesion/mass effect. Vascular: No hyperdense vessel or unexpected calcification. Skull: Normal. Negative for fracture or focal lesion. Sinuses/Orbits: No acute finding. Other: None. IMPRESSION: No acute intracranial pathology. Electronically Signed   By: Aram Candelahaddeus  Houston M.D.   On: 11/19/2020 20:42   EEG adult  Result Date: 11/20/2020 Charlsie QuestYadav, Priyanka O, MD     11/20/2020 10:39 AM Patient Name: John NeighboursChristopher Clauson MRN: [id number] Epilepsy Attending: Charlsie QuestPriyanka O Yadav Referring Physician/Provider: Dr Trey PaulaBradley Chotiner Date: 11/20/2020 Duration: 30.41 mins Patient history: 34 year old male with a history of epilepsy, arriving from jail in status epilepticus. EEG to evaluate for seizure. Level of alertness: Awake, drowsy, sleep, comatose, lethargic AEDs during EEG study: LV, LCM Technical aspects: This EEG study was done with scalp electrodes positioned according to the 10-20 International system of electrode placement. Electrical activity was acquired at a sampling rate of 500Hz  and reviewed with a high frequency filter of 70Hz  and a low frequency filter of 1Hz . EEG data were recorded continuously and digitally stored. Description: The posterior dominant rhythm consists of 9 Hz activity of moderate voltage (25-35 uV) seen predominantly in posterior head regions, symmetric and reactive to eye opening and eye closing. There is an  excessive amount of 15 to 18 Hz beta activity distributed symmetrically and diffusely. Hyperventilation and photic stimulation were not performed. Patient event was recorded at 857 during which patient was noted to have eye fluttering, chest jerking, not responding, left index finger flexion extension movement.  Concomitant EEG before, during and after the event did not show any EEG change to suggest seizure. ABNORMALITY -Excessive beta, generalized IMPRESSION: This study is within normal limits. No seizures or epileptiform discharges were seen throughout the recording. The excessive beta activity seen in the background is most likely due to the effect of benzodiazepine and is a benign EEG pattern. One event was recorded at 857 as described above without concomitant EEG change and was a Non epileptic event. Charlsie QuestPriyanka O Yadav   CT Head Wo Contrast  Final Result      Scheduled Meds: . apixaban  5 mg Oral BID  . lacosamide  100 mg Oral BID  . levETIRAcetam  1,500 mg Oral BID   PRN Meds: acetaminophen **OR** acetaminophen, albuterol, HYDROcodone-acetaminophen, LORazepam, ondansetron **OR** ondansetron (ZOFRAN) IV, senna-docusate Continuous Infusions: . lactated ringers 125 mL/hr at 11/20/20 0036     LOS: 1 day  Time spent: Greater than 50% of the 35 minute visit was spent in counseling/coordination of care for the patient as laid out in the A&P.   Lewie Chamberavid Chantele Corado, MD [facility] 11/20/2020, 7:46 PM

## 2020-11-20 NOTE — Progress Notes (Signed)
EEG complete - results pending 

## 2020-11-20 NOTE — ED Notes (Signed)
Pt had witnessed 3 minute tonic-clonic seizure. Seen by this RN and EEG tech. Pt post-ictal and confuse dat this time. Keppra started.

## 2020-11-21 ENCOUNTER — Other Ambulatory Visit (HOSPITAL_COMMUNITY): Payer: Self-pay | Admitting: Internal Medicine

## 2020-11-21 DIAGNOSIS — R569 Unspecified convulsions: Secondary | ICD-10-CM

## 2020-11-21 LAB — CBC WITH DIFFERENTIAL/PLATELET
Abs Immature Granulocytes: 0.05 10*3/uL (ref 0.00–0.07)
Basophils Absolute: 0 10*3/uL (ref 0.0–0.1)
Basophils Relative: 1 %
Eosinophils Absolute: 0.1 10*3/uL (ref 0.0–0.5)
Eosinophils Relative: 2 %
HCT: 43.1 % (ref 39.0–52.0)
Hemoglobin: 14 g/dL (ref 13.0–17.0)
Immature Granulocytes: 1 %
Lymphocytes Relative: 35 %
Lymphs Abs: 2.5 10*3/uL (ref 0.7–4.0)
MCH: 28.3 pg (ref 26.0–34.0)
MCHC: 32.5 g/dL (ref 30.0–36.0)
MCV: 87.1 fL (ref 80.0–100.0)
Monocytes Absolute: 0.5 10*3/uL (ref 0.1–1.0)
Monocytes Relative: 7 %
Neutro Abs: 4.1 10*3/uL (ref 1.7–7.7)
Neutrophils Relative %: 54 %
Platelets: 240 10*3/uL (ref 150–400)
RBC: 4.95 MIL/uL (ref 4.22–5.81)
RDW: 13.2 % (ref 11.5–15.5)
WBC: 7.3 10*3/uL (ref 4.0–10.5)
nRBC: 0 % (ref 0.0–0.2)

## 2020-11-21 LAB — TSH: TSH: 5.113 u[IU]/mL — ABNORMAL HIGH (ref 0.350–4.500)

## 2020-11-21 LAB — BASIC METABOLIC PANEL
Anion gap: 9 (ref 5–15)
BUN: 12 mg/dL (ref 6–20)
CO2: 26 mmol/L (ref 22–32)
Calcium: 9.1 mg/dL (ref 8.9–10.3)
Chloride: 104 mmol/L (ref 98–111)
Creatinine, Ser: 0.99 mg/dL (ref 0.61–1.24)
GFR, Estimated: >60 mL/min (ref >60)
Glucose, Bld: 89 mg/dL (ref 70–99)
Potassium: 4.4 mmol/L (ref 3.5–5.1)
Sodium: 139 mmol/L (ref 135–145)

## 2020-11-21 LAB — MAGNESIUM: Magnesium: 2.2 mg/dL (ref 1.7–2.4)

## 2020-11-21 MED ORDER — LEVETIRACETAM 750 MG PO TABS: 1500.0000 mg | ORAL_TABLET | Freq: Two times a day (BID) | ORAL | 3 refills | Status: DC

## 2020-11-21 MED ORDER — LACOSAMIDE 200 MG PO TABS: 200.0000 mg | ORAL_TABLET | Freq: Two times a day (BID) | ORAL | 3 refills | Status: DC

## 2020-11-21 MED ORDER — LACOSAMIDE 100 MG PO TABS: 200.0000 mg | ORAL_TABLET | Freq: Two times a day (BID) | ORAL | 3 refills | Status: DC

## 2020-11-21 MED ORDER — LEVETIRACETAM 750 MG PO TABS: 750.0000 mg | ORAL_TABLET | Freq: Two times a day (BID) | ORAL | Status: DC

## 2020-11-21 MED ORDER — LEVETIRACETAM 750 MG PO TABS: 750.0000 mg | ORAL_TABLET | Freq: Two times a day (BID) | ORAL | 3 refills | Status: DC

## 2020-11-21 MED ORDER — LACOSAMIDE 100 MG PO TABS: 100.0000 mg | ORAL_TABLET | Freq: Two times a day (BID) | ORAL | 3 refills | Status: DC

## 2020-11-21 MED ORDER — COVID-19 MRNA VACC (MODERNA) 100 MCG/0.5ML IM SUSP
0.5000 mL | Freq: Once | INTRAMUSCULAR | Status: DC
  Filled 2020-11-21: qty 0.5

## 2020-11-21 MED ORDER — LACOSAMIDE 200 MG PO TABS: 200.0000 mg | ORAL_TABLET | Freq: Two times a day (BID) | ORAL | Status: DC

## 2020-11-21 MED FILL — VIMPAT 100 MG TABLET: 100 | 30 days supply | Qty: 60 | Fill #0

## 2020-11-21 NOTE — TOC Transition Note (Signed)
Transition of Care [facility]) - CM/SW Discharge Note   Patient Details  Name: John Flynn MRN: [id number] Date of Birth: [birth year]  [facility]) CM/SW Contact:  Kermit Balo, RN Phone Number: 11/21/2020, 1:59 PM   Clinical Narrative:    Pt is [facility] back to his correction facility.  CM spoke to the medical unit at the facility and vimpat is not on their formulary. They are going to order it for the patient for next month. TOC pharmacy is going to provide him the first 30 days for free with coupon card. Guards at the bedside updated.  The Guards will provide transport back to the correction facility.   Final next level of care: Corrections Facility Barriers to Discharge: No Barriers Identified   Patient Goals and CMS Choice        Discharge Placement                       Discharge Plan and Services                                     Social Determinants of Health (SDOH) Interventions     Readmission Risk Interventions No flowsheet data found.
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
## 2020-11-22 NOTE — Discharge Summary (Signed)
Physician Discharge Summary   Derrich Gaby ZOX:[id number] DOB: [birth year] DOA: 11/19/2020  PCP: Patient, No Pcp Per  Admit date: 11/19/2020 Discharge date: 11/22/2020  Admitted From: jail Disposition:  jail Discharging physician: Lewie Chamber, MD  Recommendations for Outpatient Follow-up:  1. Follow up with neurology 2. If vimpat unable to be afforded or obtained, need to discuss with neurology about modification again   Patient discharged to home in Discharge Condition: stable CODE STATUS: Full Diet recommendation:  Diet Orders (From admission, onward)    Start     Ordered   11/21/20 0000  Diet general        11/21/20 1358          Hospital Course: Nussen Pullin is a 34 y.o. male with medical history significant for  DVT on Eliquis and seizure disorder presenting by EMS with seizure activity while in jail.  It is reported he had multiple episodes of seizure activity prior to admission.  Facility reports he had 4 seizures lasting for 3 to 4 minutes each and they were tonic-clonic.  He believes he bit his tongue and urinated on himself. He does not remember any inciting events for the seizures.  He states just prior to receiving his dinner tray he felt some pain in his mouth and had a headache and felt that he could not open his mouth.   Guards state that he then had a seizure and he was lowered to the ground.  Did not hit his head.  They report he had 2 more episodes of seizure activity en route. He did bite his tongue and was incontinent of urine during these seizures. He states he also had multiple seizures Friday and was seen in the Er and discharged back to jail. He reportedly takes Keppra.  He states his been taking it in jail but his medication of Vimpat was stopped a few months ago by himself. He states he was self medicating at that time. Vimpat was not resumed when he went to jail as they did not have the medication.  He was loaded with Keppra in the ER and resumed  back on Vimpat in addition to Keppra.  He had a breakthrough seizure the morning of 11/20/2020 lasting approximately 3 minutes which resolved prior to Ativan needing to be administered.  This was witnessed by his guards as well.  He was postictal after. Neurology was consulted and he did undergo an EEG on admission which was negative for seizure activity.  He was followed by neurology and his regimen was adjusted as needed.  Prior to discharge, he was recommended to continue on Keppra 750 mg twice daily and Vimpat 200 mg twice daily.  He was able to obtain a Vimpat prescription from the [facility] pharmacy however will need further assistance with future refills.  If this is unable to be obtained in the future, he should try and discussed this with neurology in order to transition to a more affordable medication regimen.    * Status epilepticus (HCC) - finally resolved on admission; EEG negative for seizure activity -Neurology consulted, appreciate assistance - needs to have outpatient evaluation with neuro for ongoing care - discharge meds: keppra 750 mg BID and vimpat 200 mg BID; concerned about affordability of vimpat in future especially if jail cannot provide  History of DVT (deep vein thrombosis) -Continue Eliquis    Principal Diagnosis: Status epilepticus (HCC)  Discharge Diagnoses: Active Hospital Problems   Diagnosis Date Noted  . Status epilepticus (HCC) 11/19/2020  Priority: High  . History of DVT (deep vein thrombosis) 11/19/2020    Resolved Hospital Problems  No resolved problems to display.    Discharge Instructions    Diet general   Complete by: As directed    Increase activity slowly   Complete by: As directed      Allergies as of 11/21/2020      Reactions   Eggs Or Egg-derived Products Anaphylaxis   Shock Syndrome   Influenza Virus Vaccine Anaphylaxis   Aspirin    Toxic Shock Syndrome   Nsaids    Toxic Shock Syndrome      Medication List    STOP taking  these medications   phenytoin 300 MG ER capsule Commonly known as: DILANTIN     TAKE these medications   albuterol 108 (90 Base) MCG/ACT inhaler Commonly known as: VENTOLIN HFA Inhale 2 puffs into the lungs 2 (two) times daily as needed for wheezing or shortness of breath.   apixaban 5 MG Tabs tablet Commonly known as: ELIQUIS Take 5 mg by mouth 2 (two) times daily.   Lacosamide 100 MG Tabs Take 2 tablets (200 mg total) by mouth 2 (two) times daily. What changed:   medication strength  how much to take   lacosamide 200 MG Tabs tablet Commonly known as: VIMPAT Take 1 tablet (200 mg total) by mouth 2 (two) times daily. Start after previous prescription out Start taking on: December 22, 2020 What changed: You were already taking a medication with the same name, and this prescription was added. Make sure you understand how and when to take each.   levETIRAcetam 750 MG tablet Commonly known as: KEPPRA Take 1 tablet (750 mg total) by mouth 2 (two) times daily. What changed:   medication strength  how much to take       Allergies  Allergen Reactions  . Eggs Or Egg-Derived Products Anaphylaxis    Shock Syndrome  . Influenza Virus Vaccine Anaphylaxis  . Aspirin     Toxic Shock Syndrome   . Nsaids     Toxic Shock Syndrome    Consultations: Neuro  Discharge Exam: BP 128/79 (BP Location: Left Arm)   Pulse 86   Temp 97.9 F (36.6 C) (Oral)   Resp 16   SpO2 98%  General appearance:  Awake and alert.  No further lethargy or confusion Head: Normocephalic, without obvious abnormality, atraumatic Eyes: EOMI, PERRL Lungs: clear to auscultation bilaterally Heart: regular rate and rhythm and S1, S2 normal Abdomen: normal findings: bowel sounds normal and soft, non-tender Extremities: No edema Skin: mobility and turgor normal Psych: Pressured speech Neurologic:  No focal deficits  The results of significant diagnostics from this hospitalization (including imaging,  microbiology, ancillary and laboratory) are listed below for reference.   Microbiology: Recent Results (from the past 240 hour(s))  Resp Panel by RT-PCR (Flu A&B, Covid) Nasopharyngeal Swab     Status: None   Collection Time: 11/19/20  9:35 PM   Specimen: Nasopharyngeal Swab; Nasopharyngeal(NP) swabs in vial transport medium  Result Value Ref Range Status   SARS Coronavirus 2 by RT PCR NEGATIVE NEGATIVE Final    Comment: (NOTE) SARS-CoV-2 target nucleic acids are NOT DETECTED.  The SARS-CoV-2 RNA is generally detectable in upper respiratory specimens during the acute phase of infection. The lowest concentration of SARS-CoV-2 viral copies this assay can detect is 138 copies/mL. A negative result does not preclude SARS-Cov-2 infection and should not be used as the sole basis for treatment or other patient  management decisions. A negative result may occur with  improper specimen collection/handling, submission of specimen other than nasopharyngeal swab, presence of viral mutation(s) within the areas targeted by this assay, and inadequate number of viral copies(<138 copies/mL). A negative result must be combined with clinical observations, patient history, and epidemiological information. The expected result is Negative.  Fact Sheet for Patients:  BloggerCourse.com  Fact Sheet for Healthcare Providers:  SeriousBroker.it  This test is no t yet approved or cleared by the [place] FDA and  has been authorized for detection and/or diagnosis of SARS-CoV-2 by FDA under an Emergency Use Authorization (EUA). This EUA will remain  in effect (meaning this test can be used) for the duration of the COVID-19 declaration under Section 564(b)(1) of the Act, 21 U.S.C.section 360bbb-3(b)(1), unless the authorization is terminated  or revoked sooner.       Influenza A by PCR NEGATIVE NEGATIVE Final   Influenza B by PCR NEGATIVE NEGATIVE Final     Comment: (NOTE) The Xpert Xpress SARS-CoV-2/FLU/RSV plus assay is intended as an aid in the diagnosis of influenza from Nasopharyngeal swab specimens and should not be used as a sole basis for treatment. Nasal washings and aspirates are unacceptable for Xpert Xpress SARS-CoV-2/FLU/RSV testing.  Fact Sheet for Patients: BloggerCourse.com  Fact Sheet for Healthcare Providers: SeriousBroker.it  This test is not yet approved or cleared by the [place] FDA and has been authorized for detection and/or diagnosis of SARS-CoV-2 by FDA under an Emergency Use Authorization (EUA). This EUA will remain in effect (meaning this test can be used) for the duration of the COVID-19 declaration under Section 564(b)(1) of the Act, 21 U.S.C. section 360bbb-3(b)(1), unless the authorization is terminated or revoked.  Performed at [facility], [address] [place], [place] [address]      Labs: BNP (last 3 results) No results for input(s): BNP in the last 8760 hours. Basic Metabolic Panel: Recent Labs  Lab 11/17/20 0924 11/19/20 1947 11/20/20 0333 11/21/20 0221  NA 141 138 141 139  K 3.6 4.1 3.7 4.4  CL 103 104 107 104  CO2 26 26 25 26   GLUCOSE 91 111* 105* 89  BUN 8 9 8 12   CREATININE 0.97 0.90 0.96 0.99  CALCIUM 9.0 8.8* 8.5* 9.1  MG  --   --   --  2.2   Liver Function Tests: Recent Labs  Lab 11/19/20 1947  AST 20  ALT 36  ALKPHOS 48  BILITOT 0.3  PROT 6.2*  ALBUMIN 3.2*   No results for input(s): LIPASE, AMYLASE in the last 168 hours. No results for input(s): AMMONIA in the last 168 hours. CBC: Recent Labs  Lab 11/17/20 0924 11/19/20 1947 11/20/20 0333 11/21/20 0221  WBC 7.1 5.7 7.9 7.3  NEUTROABS 4.1 3.4  --  4.1  HGB 13.8 13.9 13.8 14.0  HCT 46.7 45.4 45.2 43.1  MCV 91.9 88.3 89.5 87.1  PLT 221 237 247 240   Cardiac Enzymes: No results for input(s): CKTOTAL, CKMB, CKMBINDEX, TROPONINI in the last  168 hours. BNP: Invalid input(s): POCBNP CBG: No results for input(s): GLUCAP in the last 168 hours. D-Dimer No results for input(s): DDIMER in the last 72 hours. Hgb A1c No results for input(s): HGBA1C in the last 72 hours. Lipid Profile No results for input(s): CHOL, HDL, LDLCALC, TRIG, CHOLHDL, LDLDIRECT in the last 72 hours. Thyroid function studies Recent Labs    11/21/20 0221  TSH 5.113*   Anemia work up No results for  input(s): VITAMINB12, FOLATE, FERRITIN, TIBC, IRON, RETICCTPCT in the last 72 hours. Urinalysis No results found for: COLORURINE, APPEARANCEUR, LABSPEC, PHURINE, GLUCOSEU, HGBUR, BILIRUBINUR, KETONESUR, PROTEINUR, UROBILINOGEN, NITRITE, LEUKOCYTESUR Sepsis Labs Invalid input(s): PROCALCITONIN,  WBC,  LACTICIDVEN Microbiology Recent Results (from the past 240 hour(s))  Resp Panel by RT-PCR (Flu A&B, Covid) Nasopharyngeal Swab     Status: None   Collection Time: 11/19/20  9:35 PM   Specimen: Nasopharyngeal Swab; Nasopharyngeal(NP) swabs in vial transport medium  Result Value Ref Range Status   SARS Coronavirus 2 by RT PCR NEGATIVE NEGATIVE Final    Comment: (NOTE) SARS-CoV-2 target nucleic acids are NOT DETECTED.  The SARS-CoV-2 RNA is generally detectable in upper respiratory specimens during the acute phase of infection. The lowest concentration of SARS-CoV-2 viral copies this assay can detect is 138 copies/mL. A negative result does not preclude SARS-Cov-2 infection and should not be used as the sole basis for treatment or other patient management decisions. A negative result may occur with  improper specimen collection/handling, submission of specimen other than nasopharyngeal swab, presence of viral mutation(s) within the areas targeted by this assay, and inadequate number of viral copies(<138 copies/mL). A negative result must be combined with clinical observations, patient history, and epidemiological information. The expected result is  Negative.  Fact Sheet for Patients:  BloggerCourse.comhttps://www.fda.gov/media/152166/download  Fact Sheet for Healthcare Providers:  SeriousBroker.ithttps://www.fda.gov/media/152162/download  This test is no t yet approved or cleared by the [place] FDA and  has been authorized for detection and/or diagnosis of SARS-CoV-2 by FDA under an Emergency Use Authorization (EUA). This EUA will remain  in effect (meaning this test can be used) for the duration of the COVID-19 declaration under Section 564(b)(1) of the Act, 21 U.S.C.section 360bbb-3(b)(1), unless the authorization is terminated  or revoked sooner.       Influenza A by PCR NEGATIVE NEGATIVE Final   Influenza B by PCR NEGATIVE NEGATIVE Final    Comment: (NOTE) The Xpert Xpress SARS-CoV-2/FLU/RSV plus assay is intended as an aid in the diagnosis of influenza from Nasopharyngeal swab specimens and should not be used as a sole basis for treatment. Nasal washings and aspirates are unacceptable for Xpert Xpress SARS-CoV-2/FLU/RSV testing.  Fact Sheet for Patients: BloggerCourse.comhttps://www.fda.gov/media/152166/download  Fact Sheet for Healthcare Providers: SeriousBroker.ithttps://www.fda.gov/media/152162/download  This test is not yet approved or cleared by the [place] FDA and has been authorized for detection and/or diagnosis of SARS-CoV-2 by FDA under an Emergency Use Authorization (EUA). This EUA will remain in effect (meaning this test can be used) for the duration of the COVID-19 declaration under Section 564(b)(1) of the Act, 21 U.S.C. section 360bbb-3(b)(1), unless the authorization is terminated or revoked.  Performed at [facility], [address] [place], [place] [address]     Procedures/Studies: CT Head Wo Contrast  Result Date: 11/19/2020 CLINICAL DATA:  Seizure. EXAM: CT HEAD WITHOUT CONTRAST TECHNIQUE: Contiguous axial images were obtained from the base of the skull through the vertex without intravenous contrast. COMPARISON:  November 17, 2020  FINDINGS: Brain: No evidence of acute infarction, hemorrhage, hydrocephalus, extra-axial collection or mass lesion/mass effect. Vascular: No hyperdense vessel or unexpected calcification. Skull: Normal. Negative for fracture or focal lesion. Sinuses/Orbits: No acute finding. Other: None. IMPRESSION: No acute intracranial pathology. Electronically Signed   By: Aram Candelahaddeus  Houston M.D.   On: 11/19/2020 20:42   CT Head Wo Contrast  Result Date: 11/17/2020 CLINICAL DATA:  34 year old male with a history of mental status changes EXAM: CT HEAD WITHOUT CONTRAST CT  CERVICAL SPINE WITHOUT CONTRAST TECHNIQUE: Multidetector CT imaging of the head and cervical spine was performed following the standard protocol without intravenous contrast. Multiplanar CT image reconstructions of the cervical spine were also generated. COMPARISON:  None. FINDINGS: CT HEAD FINDINGS Brain: No acute intracranial hemorrhage. No midline shift or mass effect. Gray-white differentiation maintained. Unremarkable appearance of the ventricular system. Vascular: Unremarkable. Skull: No acute fracture.  No aggressive bone lesion identified. Sinuses/Orbits: Unremarkable appearance of the orbits. Mastoid air cells clear. No middle ear effusion. No significant sinus disease. Other: None CT CERVICAL SPINE FINDINGS Alignment: Craniocervical junction aligned. Anatomic alignment of the cervical elements. No subluxation. Skull base and vertebrae: No acute fracture at the skullbase. Vertebral body heights relatively maintained. No acute fracture identified. Soft tissues and spinal canal: Unremarkable cervical soft tissues. Lymph nodes are present, though not enlarged. Disc levels: Mild disc space narrowing with endplate changes and uncovertebral joint disease at C5-C6 and C6-C7. Schmorl's node of the inferior endplate of C6. No significant bony canal narrowing. No significant neural foraminal narrowing, with the greatest degree of foraminal narrowing at C5-C6  and C6-C7 secondary to uncovertebral joint disease. Upper chest: Emphysematous changes at the lung apices Other: No bony canal narrowing. IMPRESSION: Head CT: No acute intracranial abnormality. Cervical CT: No acute finding. Electronically Signed   By: Gilmer Mor D.O.   On: 11/17/2020 10:56   CT Head Wo Contrast  Result Date: 10/29/2020 CLINICAL DATA:  Nontraumatic seizure. EXAM: CT HEAD WITHOUT CONTRAST TECHNIQUE: Contiguous axial images were obtained from the base of the skull through the vertex without intravenous contrast. COMPARISON:  October 06, 2020 FINDINGS: Brain: No evidence of acute infarction, hemorrhage, hydrocephalus, extra-axial collection or mass lesion/mass effect. Vascular: No hyperdense vessel or unexpected calcification. Skull: Normal. Negative for fracture or focal lesion. Sinuses/Orbits: No acute finding. Other: None. IMPRESSION: No acute intracranial abnormality. Electronically Signed   By: Ted Mcalpine M.D.   On: 10/29/2020 12:49   CT Cervical Spine Wo Contrast  Result Date: 11/17/2020 CLINICAL DATA:  34 year old male with a history of mental status changes EXAM: CT HEAD WITHOUT CONTRAST CT CERVICAL SPINE WITHOUT CONTRAST TECHNIQUE: Multidetector CT imaging of the head and cervical spine was performed following the standard protocol without intravenous contrast. Multiplanar CT image reconstructions of the cervical spine were also generated. COMPARISON:  None. FINDINGS: CT HEAD FINDINGS Brain: No acute intracranial hemorrhage. No midline shift or mass effect. Gray-white differentiation maintained. Unremarkable appearance of the ventricular system. Vascular: Unremarkable. Skull: No acute fracture.  No aggressive bone lesion identified. Sinuses/Orbits: Unremarkable appearance of the orbits. Mastoid air cells clear. No middle ear effusion. No significant sinus disease. Other: None CT CERVICAL SPINE FINDINGS Alignment: Craniocervical junction aligned. Anatomic alignment of the  cervical elements. No subluxation. Skull base and vertebrae: No acute fracture at the skullbase. Vertebral body heights relatively maintained. No acute fracture identified. Soft tissues and spinal canal: Unremarkable cervical soft tissues. Lymph nodes are present, though not enlarged. Disc levels: Mild disc space narrowing with endplate changes and uncovertebral joint disease at C5-C6 and C6-C7. Schmorl's node of the inferior endplate of C6. No significant bony canal narrowing. No significant neural foraminal narrowing, with the greatest degree of foraminal narrowing at C5-C6 and C6-C7 secondary to uncovertebral joint disease. Upper chest: Emphysematous changes at the lung apices Other: No bony canal narrowing. IMPRESSION: Head CT: No acute intracranial abnormality. Cervical CT: No acute finding. Electronically Signed   By: Gilmer Mor D.O.   On: 11/17/2020 10:56   EEG  adult  Result Date: 11/20/2020 Charlsie Quest, MD     11/20/2020 10:39 AM Patient Name: Raydell Maners MRN: [id number] Epilepsy Attending: Charlsie Quest Referring Physician/Provider: Dr Trey Paula Date: 11/20/2020 Duration: 30.41 mins Patient history: 34 year old male with a history of epilepsy, arriving from jail in status epilepticus. EEG to evaluate for seizure. Level of alertness: Awake, drowsy, sleep, comatose, lethargic AEDs during EEG study: LV, LCM Technical aspects: This EEG study was done with scalp electrodes positioned according to the 10-20 International system of electrode placement. Electrical activity was acquired at a sampling rate of 500Hz  and reviewed with a high frequency filter of 70Hz  and a low frequency filter of 1Hz . EEG data were recorded continuously and digitally stored. Description: The posterior dominant rhythm consists of 9 Hz activity of moderate voltage (25-35 uV) seen predominantly in posterior head regions, symmetric and reactive to eye opening and eye closing. There is an excessive amount of 15  to 18 Hz beta activity distributed symmetrically and diffusely. Hyperventilation and photic stimulation were not performed. Patient event was recorded at 857 during which patient was noted to have eye fluttering, chest jerking, not responding, left index finger flexion extension movement.  Concomitant EEG before, during and after the event did not show any EEG change to suggest seizure. ABNORMALITY -Excessive beta, generalized IMPRESSION: This study is within normal limits. No seizures or epileptiform discharges were seen throughout the recording. The excessive beta activity seen in the background is most likely due to the effect of benzodiazepine and is a benign EEG pattern. One event was recorded at 857 as described above without concomitant EEG change and was a Non epileptic event.     Time coordinating discharge: Over 30 minutes    , MD  [facility] 11/22/2020, 4:48 PM

## 2020-12-25 IMAGING — CT CT HEAD W/O CM
3 of 6 series · 16 of 47 positions shown, 19 images · non-contrast
Comparison: 08/30/2020

CLINICAL DATA: Breakthrough seizures with head trauma. Taking
Xarelto.

EXAM:
CT HEAD WITHOUT CONTRAST
TECHNIQUE: Contiguous axial images were obtained from the base of the skull
through the vertex without intravenous contrast.

[Series 2: head wo · axial · 0.47mm/px · z∈[-103,+22]mm · 11 of 31 slices shown, 14 images]
[im 3/31  brain]
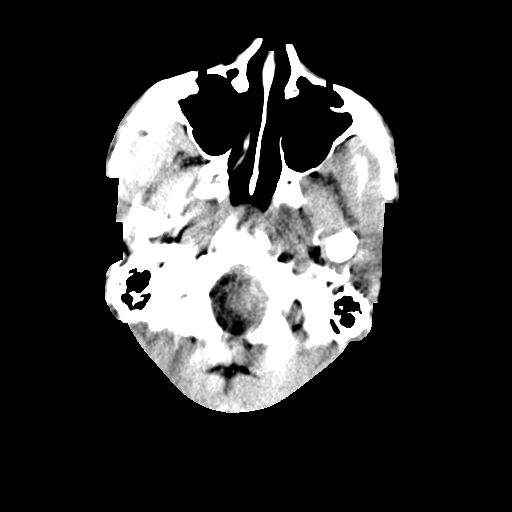
[im 3/31  bone]
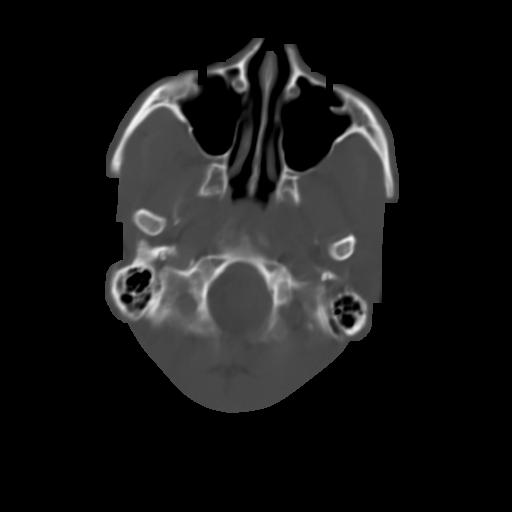
[im 5/31  brain]
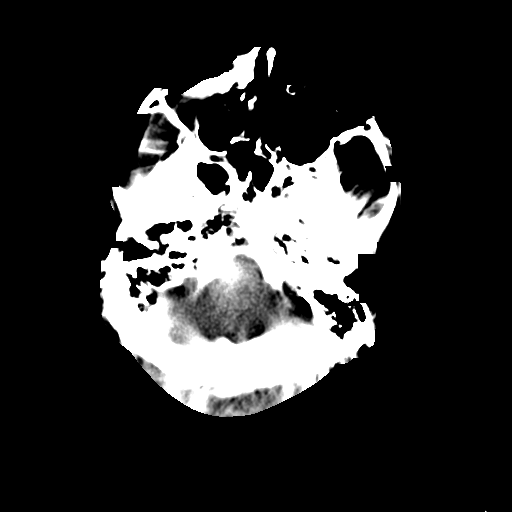
[im 7/31  brain]
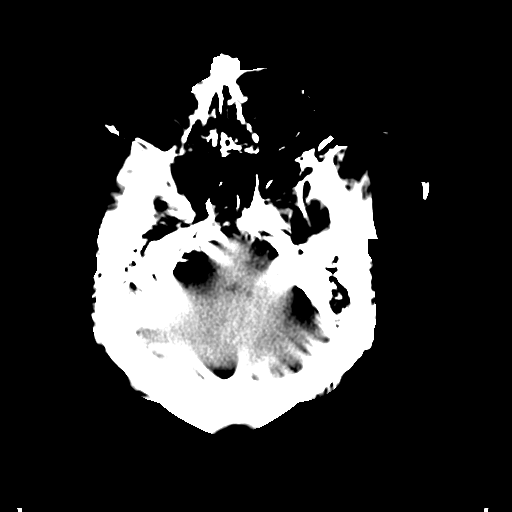
[im 11/31  brain]
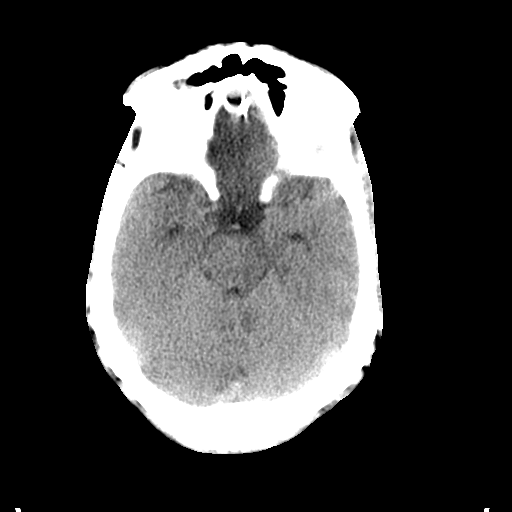
[im 13/31  brain]
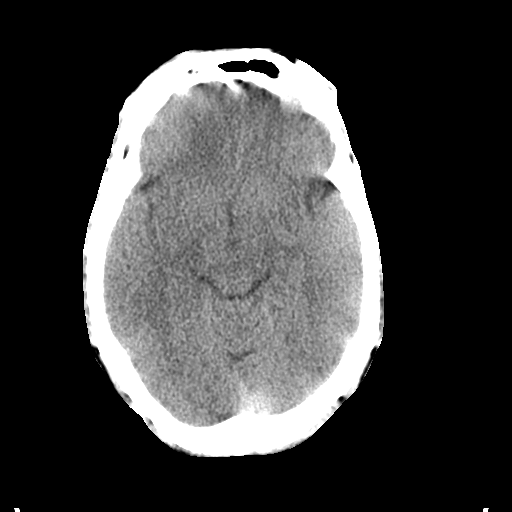
[im 13/31  bone]
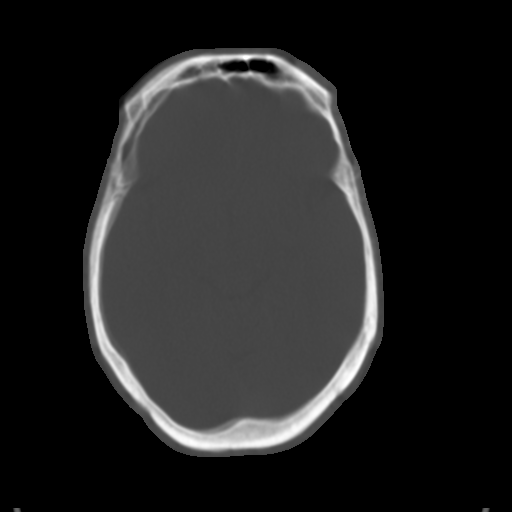
[im 16/31  brain]
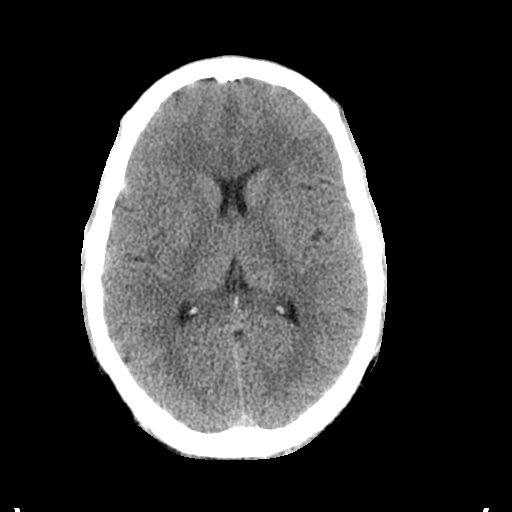
[im 18/31  brain]
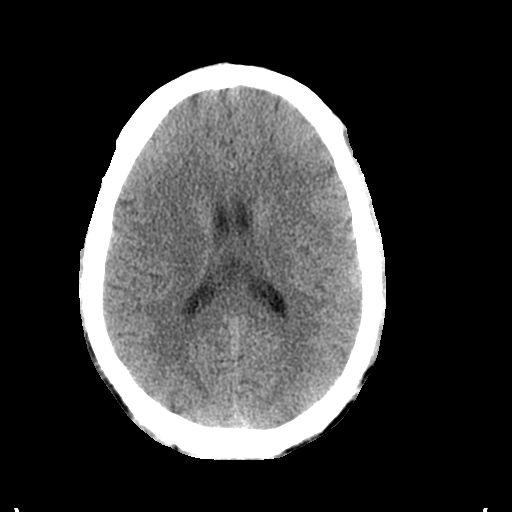
[im 20/31  brain]
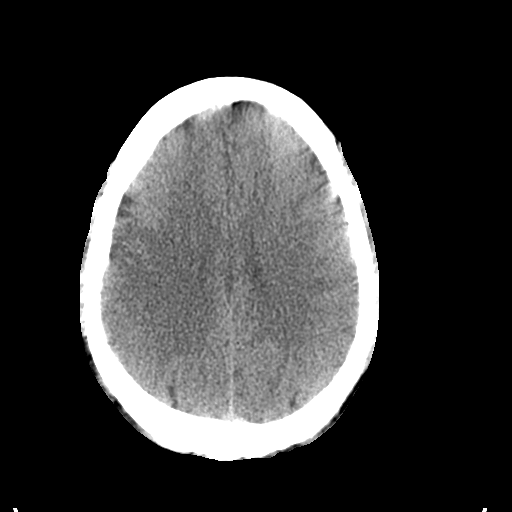
[im 24/31  brain]
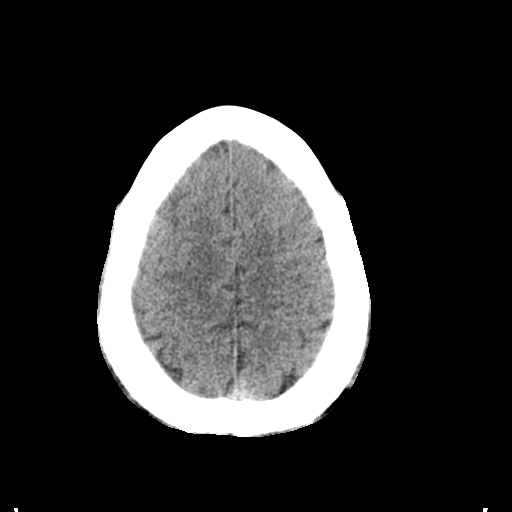
[im 24/31  bone]
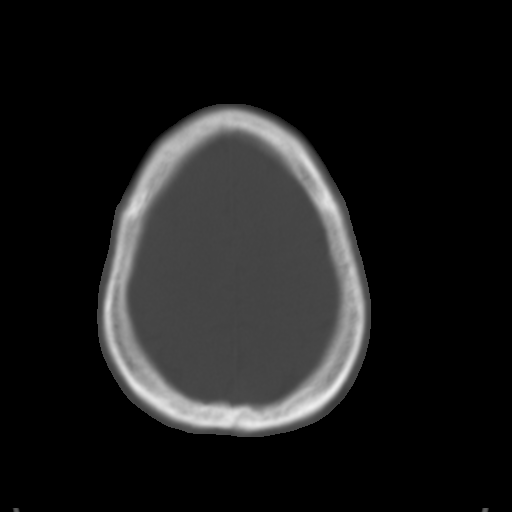
[im 26/31  brain]
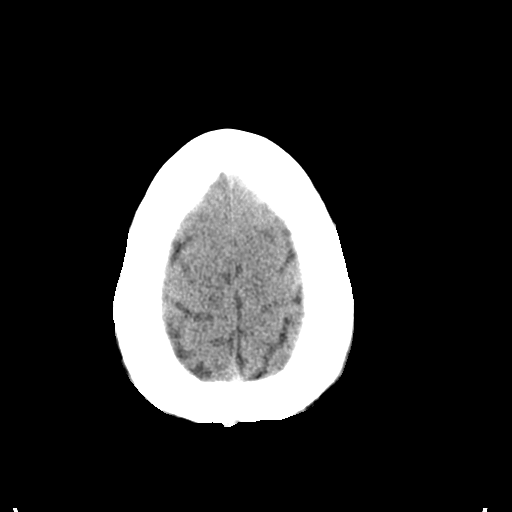
[im 28/31  brain]
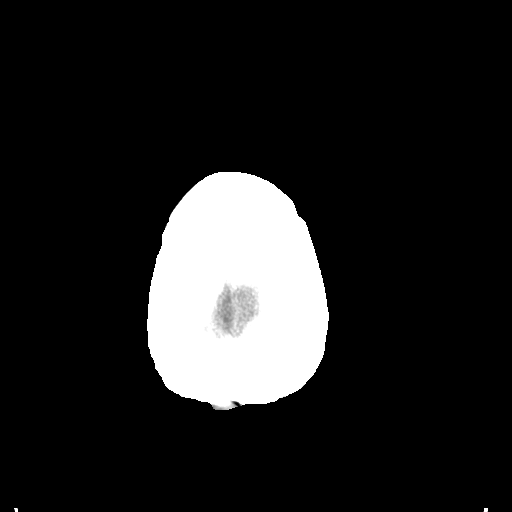

[Series 4: coronal soft tissue · coronal · 0.29mm/px · 3 of 65 slices shown]
[im 17/65  brain]
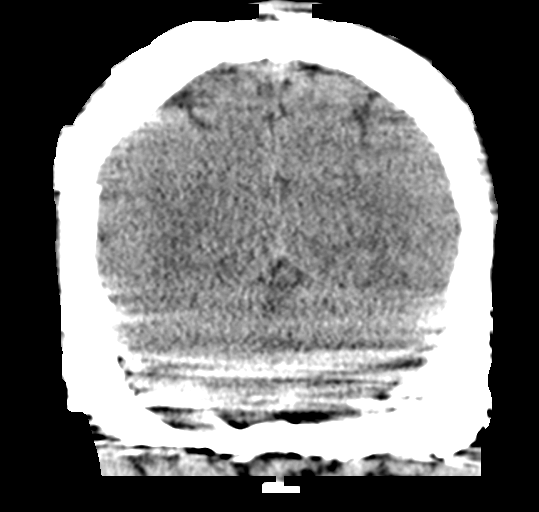
[im 33/65  brain]
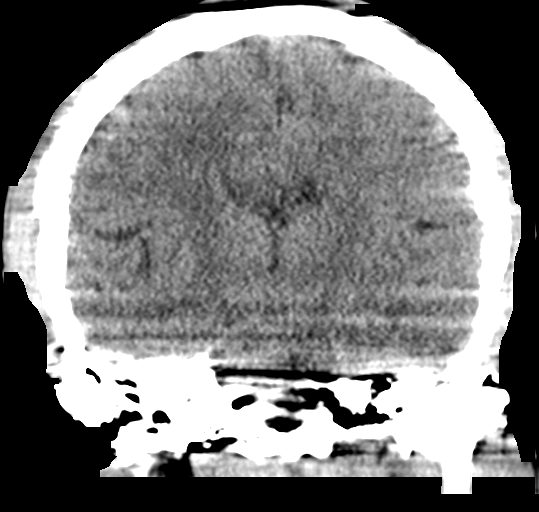
[im 49/65  brain]
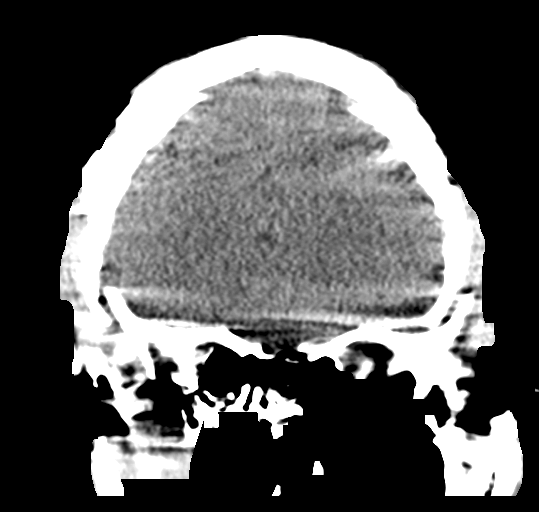

[Series 9: sagittal soft tissue · sagittal · 0.29mm/px · 2 of 51 slices shown]
[im 17/51  brain]
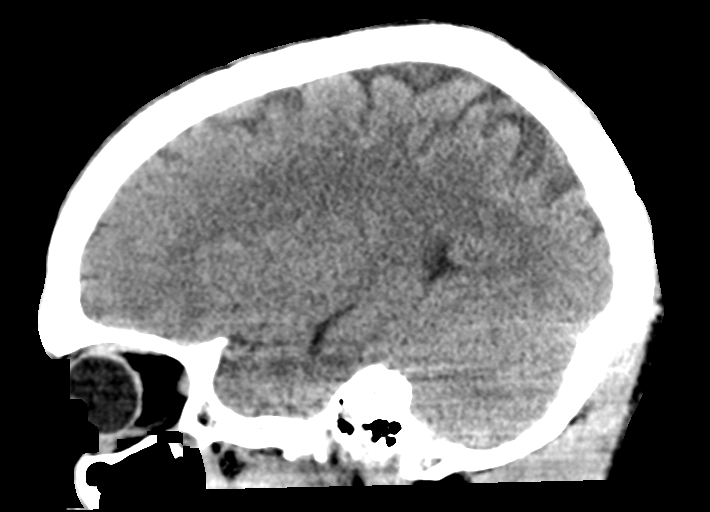
[im 34/51  brain]
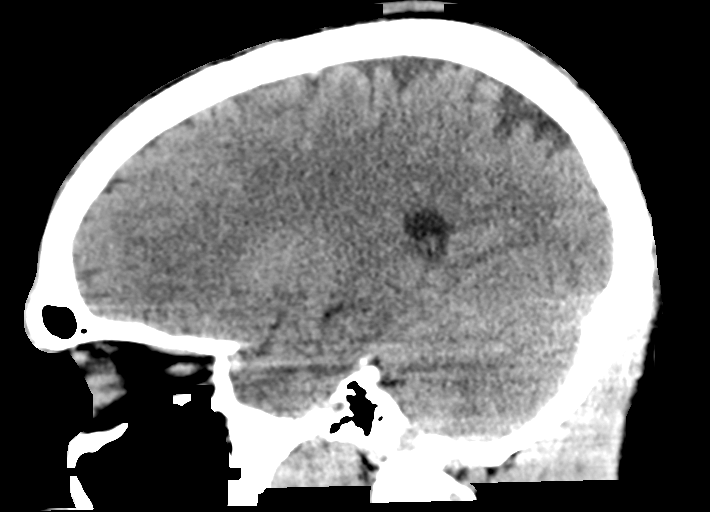

[16 of 47 positions shown; findings below may reference images not displayed]

FINDINGS: Brain: Normal appearing cerebral hemispheres and posterior fossa
structures. Normal size and position of the ventricles. No
intracranial hemorrhage, mass lesion or CT evidence of acute
infarction.

Vascular: No hyperdense vessel or unexpected calcification.

Skull: Normal. Negative for fracture or focal lesion.

Sinuses/Orbits: Unremarkable.

Other: None.
IMPRESSION: Normal examination.

## 2021-01-04 ENCOUNTER — Inpatient Hospital Stay (HOSPITAL_COMMUNITY)
Admission: EM | Admit: 2021-01-04 | Discharge: 2021-01-08 | DRG: 101 | Attending: Internal Medicine | Admitting: Internal Medicine

## 2021-01-04 ENCOUNTER — Emergency Department (HOSPITAL_COMMUNITY)

## 2021-01-04 DIAGNOSIS — G40909 Epilepsy, unspecified, not intractable, without status epilepticus: Secondary | ICD-10-CM

## 2021-01-04 DIAGNOSIS — G40409 Other generalized epilepsy and epileptic syndromes, not intractable, without status epilepticus: Principal | ICD-10-CM | POA: Diagnosis present

## 2021-01-04 DIAGNOSIS — F4310 Post-traumatic stress disorder, unspecified: Secondary | ICD-10-CM | POA: Diagnosis present

## 2021-01-04 DIAGNOSIS — F17210 Nicotine dependence, cigarettes, uncomplicated: Secondary | ICD-10-CM | POA: Diagnosis present

## 2021-01-04 DIAGNOSIS — Z8619 Personal history of other infectious and parasitic diseases: Secondary | ICD-10-CM

## 2021-01-04 DIAGNOSIS — R569 Unspecified convulsions: Secondary | ICD-10-CM | POA: Diagnosis not present

## 2021-01-04 DIAGNOSIS — R519 Headache, unspecified: Secondary | ICD-10-CM | POA: Diagnosis present

## 2021-01-04 DIAGNOSIS — R21 Rash and other nonspecific skin eruption: Secondary | ICD-10-CM | POA: Diagnosis present

## 2021-01-04 DIAGNOSIS — H538 Other visual disturbances: Secondary | ICD-10-CM | POA: Diagnosis present

## 2021-01-04 DIAGNOSIS — Z86718 Personal history of other venous thrombosis and embolism: Secondary | ICD-10-CM

## 2021-01-04 DIAGNOSIS — Z86711 Personal history of pulmonary embolism: Secondary | ICD-10-CM

## 2021-01-04 DIAGNOSIS — Z886 Allergy status to analgesic agent status: Secondary | ICD-10-CM

## 2021-01-04 DIAGNOSIS — Z887 Allergy status to serum and vaccine status: Secondary | ICD-10-CM

## 2021-01-04 DIAGNOSIS — Z7901 Long term (current) use of anticoagulants: Secondary | ICD-10-CM

## 2021-01-04 DIAGNOSIS — F200 Paranoid schizophrenia: Secondary | ICD-10-CM | POA: Diagnosis present

## 2021-01-04 DIAGNOSIS — F445 Conversion disorder with seizures or convulsions: Principal | ICD-10-CM | POA: Diagnosis present

## 2021-01-04 DIAGNOSIS — J449 Chronic obstructive pulmonary disease, unspecified: Secondary | ICD-10-CM | POA: Diagnosis present

## 2021-01-04 DIAGNOSIS — M542 Cervicalgia: Secondary | ICD-10-CM | POA: Diagnosis not present

## 2021-01-04 DIAGNOSIS — Z20822 Contact with and (suspected) exposure to covid-19: Secondary | ICD-10-CM | POA: Diagnosis present

## 2021-01-04 DIAGNOSIS — R946 Abnormal results of thyroid function studies: Secondary | ICD-10-CM | POA: Diagnosis present

## 2021-01-04 DIAGNOSIS — Z8249 Family history of ischemic heart disease and other diseases of the circulatory system: Secondary | ICD-10-CM

## 2021-01-04 DIAGNOSIS — Z72820 Sleep deprivation: Secondary | ICD-10-CM

## 2021-01-04 DIAGNOSIS — E785 Hyperlipidemia, unspecified: Secondary | ICD-10-CM | POA: Diagnosis present

## 2021-01-04 DIAGNOSIS — Z79899 Other long term (current) drug therapy: Secondary | ICD-10-CM

## 2021-01-04 HISTORY — DX: Accidental discharge from unspecified firearms or gun, initial encounter: W34.00XA

## 2021-01-04 HISTORY — DX: Unspecified firearm discharge, undetermined intent, initial encounter: Y24.9XXA

## 2021-01-04 LAB — BASIC METABOLIC PANEL
Anion gap: 8 (ref 5–15)
BUN: 8 mg/dL (ref 6–20)
CO2: 26 mmol/L (ref 22–32)
Calcium: 9.3 mg/dL (ref 8.9–10.3)
Chloride: 103 mmol/L (ref 98–111)
Creatinine, Ser: 1.13 mg/dL (ref 0.61–1.24)
GFR, Estimated: >60 mL/min (ref >60)
Glucose, Bld: 111 mg/dL — ABNORMAL HIGH (ref 70–99)
Potassium: 4 mmol/L (ref 3.5–5.1)
Sodium: 137 mmol/L (ref 135–145)

## 2021-01-04 LAB — CBC
HCT: 46.1 % (ref 39.0–52.0)
Hemoglobin: 14.5 g/dL (ref 13.0–17.0)
MCH: 27.6 pg (ref 26.0–34.0)
MCHC: 31.5 g/dL (ref 30.0–36.0)
MCV: 87.8 fL (ref 80.0–100.0)
Platelets: 282 10*3/uL (ref 150–400)
RBC: 5.25 MIL/uL (ref 4.22–5.81)
RDW: 13.2 % (ref 11.5–15.5)
WBC: 4.4 10*3/uL (ref 4.0–10.5)
nRBC: 0 % (ref 0.0–0.2)

## 2021-01-04 MED ORDER — BUDESONIDE 0.25 MG/2ML IN SUSP
0.2500 mg | Freq: Two times a day (BID) | RESPIRATORY_TRACT | Status: DC
  Administered 2021-01-06 – 2021-01-08 (×5): 0.25 mg via RESPIRATORY_TRACT
  Filled 2021-01-04 (×10): qty 2

## 2021-01-04 MED ORDER — APIXABAN 5 MG PO TABS
5.0000 mg | ORAL_TABLET | Freq: Two times a day (BID) | ORAL | Status: DC
  Administered 2021-01-05 – 2021-01-08 (×8): 5 mg via ORAL
  Filled 2021-01-04 (×8): qty 1

## 2021-01-04 MED ORDER — SODIUM CHLORIDE 0.9 % IV SOLN
75.0000 mL/h | INTRAVENOUS | Status: DC
  Administered 2021-01-05 (×2): 75 mL/h via INTRAVENOUS

## 2021-01-04 MED ORDER — ALBUTEROL SULFATE HFA 108 (90 BASE) MCG/ACT IN AERS
2.0000 | INHALATION_SPRAY | Freq: Two times a day (BID) | RESPIRATORY_TRACT | Status: DC | PRN
  Filled 2021-01-04: qty 6.7

## 2021-01-04 MED ORDER — ETOMIDATE 2 MG/ML IV SOLN: INTRAVENOUS | Status: DC | PRN

## 2021-01-04 MED ORDER — IPRATROPIUM-ALBUTEROL 0.5-2.5 (3) MG/3ML IN SOLN
3.0000 mL | RESPIRATORY_TRACT | Status: DC | PRN
  Filled 2021-01-04: qty 3

## 2021-01-04 MED ORDER — ACETAMINOPHEN 325 MG PO TABS
650.0000 mg | ORAL_TABLET | Freq: Once | ORAL | Status: AC
  Administered 2021-01-04: 650 mg via ORAL
  Filled 2021-01-04: qty 2

## 2021-01-04 MED ORDER — LORAZEPAM 2 MG/ML IJ SOLN
INTRAMUSCULAR | Status: AC
  Administered 2021-01-04: 1 mg
  Filled 2021-01-04: qty 1

## 2021-01-04 MED ORDER — ROCURONIUM BROMIDE 50 MG/5ML IV SOLN: INTRAVENOUS | Status: DC | PRN

## 2021-01-04 MED ORDER — LACOSAMIDE 50 MG PO TABS
150.0000 mg | ORAL_TABLET | Freq: Two times a day (BID) | ORAL | Status: DC
  Administered 2021-01-05: 150 mg via ORAL
  Filled 2021-01-04: qty 3

## 2021-01-04 MED ORDER — LORAZEPAM 2 MG/ML IJ SOLN
1.0000 mg | INTRAMUSCULAR | Status: DC | PRN
  Administered 2021-01-05: 2 mg via INTRAVENOUS
  Filled 2021-01-04: qty 1

## 2021-01-04 MED ORDER — EPINEPHRINE 1 MG/10ML IJ SOSY: PREFILLED_SYRINGE | INTRAMUSCULAR | Status: DC | PRN

## 2021-01-04 MED ORDER — ACETAMINOPHEN 325 MG PO TABS
650.0000 mg | ORAL_TABLET | ORAL | Status: DC | PRN
  Administered 2021-01-05 – 2021-01-08 (×5): 650 mg via ORAL
  Filled 2021-01-04 (×5): qty 2

## 2021-01-04 MED ORDER — CICLESONIDE 80 MCG/ACT IN AERS: 1.0000 | INHALATION_SPRAY | Freq: Two times a day (BID) | RESPIRATORY_TRACT | Status: DC

## 2021-01-04 MED ORDER — ACETAMINOPHEN 650 MG RE SUPP: 650.0000 mg | RECTAL | Status: DC | PRN

## 2021-01-04 MED ORDER — SODIUM CHLORIDE 0.9 % IV SOLN
4000.0000 mg | Freq: Once | INTRAVENOUS | Status: AC
  Administered 2021-01-04: 4000 mg via INTRAVENOUS
  Filled 2021-01-04: qty 40

## 2021-01-04 NOTE — ED Notes (Signed)
Pt given sandwich bag, graham crackers & PB, coke
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
## 2021-01-04 NOTE — Code Documentation (Addendum)
This RN pulled to a code. Other Staff remains with pt while seizing.

## 2021-01-04 NOTE — ED Triage Notes (Signed)
Pt from jail in custody with hx of seizures and had one unwitnessed seizure and one witnessed with ems lasting 45 seconds. EMS 5 mg versed IM. Pt is on eliquis

## 2021-01-04 NOTE — Code Documentation (Addendum)
Pt seizing staff assisting this RN at bedside.

## 2021-01-05 ENCOUNTER — Encounter (HOSPITAL_COMMUNITY): Payer: Self-pay | Admitting: Internal Medicine

## 2021-01-05 ENCOUNTER — Inpatient Hospital Stay (HOSPITAL_COMMUNITY)

## 2021-01-05 DIAGNOSIS — F200 Paranoid schizophrenia: Secondary | ICD-10-CM | POA: Diagnosis present

## 2021-01-05 DIAGNOSIS — E785 Hyperlipidemia, unspecified: Secondary | ICD-10-CM | POA: Diagnosis present

## 2021-01-05 DIAGNOSIS — R519 Headache, unspecified: Secondary | ICD-10-CM

## 2021-01-05 DIAGNOSIS — Z72820 Sleep deprivation: Secondary | ICD-10-CM | POA: Diagnosis not present

## 2021-01-05 DIAGNOSIS — Z8249 Family history of ischemic heart disease and other diseases of the circulatory system: Secondary | ICD-10-CM | POA: Diagnosis not present

## 2021-01-05 DIAGNOSIS — R21 Rash and other nonspecific skin eruption: Secondary | ICD-10-CM

## 2021-01-05 DIAGNOSIS — G40409 Other generalized epilepsy and epileptic syndromes, not intractable, without status epilepticus: Secondary | ICD-10-CM | POA: Diagnosis present

## 2021-01-05 DIAGNOSIS — Z20822 Contact with and (suspected) exposure to covid-19: Secondary | ICD-10-CM | POA: Diagnosis present

## 2021-01-05 DIAGNOSIS — J449 Chronic obstructive pulmonary disease, unspecified: Secondary | ICD-10-CM | POA: Diagnosis present

## 2021-01-05 DIAGNOSIS — Z7901 Long term (current) use of anticoagulants: Secondary | ICD-10-CM | POA: Diagnosis not present

## 2021-01-05 DIAGNOSIS — R569 Unspecified convulsions: Secondary | ICD-10-CM | POA: Diagnosis present

## 2021-01-05 DIAGNOSIS — Z86711 Personal history of pulmonary embolism: Secondary | ICD-10-CM | POA: Diagnosis not present

## 2021-01-05 DIAGNOSIS — R946 Abnormal results of thyroid function studies: Secondary | ICD-10-CM

## 2021-01-05 DIAGNOSIS — Z886 Allergy status to analgesic agent status: Secondary | ICD-10-CM | POA: Diagnosis not present

## 2021-01-05 DIAGNOSIS — H538 Other visual disturbances: Secondary | ICD-10-CM | POA: Diagnosis present

## 2021-01-05 DIAGNOSIS — G40909 Epilepsy, unspecified, not intractable, without status epilepticus: Secondary | ICD-10-CM

## 2021-01-05 DIAGNOSIS — Z79899 Other long term (current) drug therapy: Secondary | ICD-10-CM | POA: Diagnosis not present

## 2021-01-05 DIAGNOSIS — R600 Localized edema: Secondary | ICD-10-CM

## 2021-01-05 DIAGNOSIS — F4310 Post-traumatic stress disorder, unspecified: Secondary | ICD-10-CM | POA: Diagnosis present

## 2021-01-05 DIAGNOSIS — A5149 Other secondary syphilitic conditions: Secondary | ICD-10-CM | POA: Diagnosis not present

## 2021-01-05 DIAGNOSIS — F445 Conversion disorder with seizures or convulsions: Secondary | ICD-10-CM | POA: Diagnosis not present

## 2021-01-05 DIAGNOSIS — M542 Cervicalgia: Secondary | ICD-10-CM | POA: Diagnosis not present

## 2021-01-05 DIAGNOSIS — Z8619 Personal history of other infectious and parasitic diseases: Secondary | ICD-10-CM | POA: Diagnosis not present

## 2021-01-05 DIAGNOSIS — Z887 Allergy status to serum and vaccine status: Secondary | ICD-10-CM | POA: Diagnosis not present

## 2021-01-05 DIAGNOSIS — Z86718 Personal history of other venous thrombosis and embolism: Secondary | ICD-10-CM | POA: Diagnosis not present

## 2021-01-05 DIAGNOSIS — F17210 Nicotine dependence, cigarettes, uncomplicated: Secondary | ICD-10-CM | POA: Diagnosis present

## 2021-01-05 LAB — CBG MONITORING, ED: Glucose-Capillary: 106 mg/dL — ABNORMAL HIGH (ref 70–99)

## 2021-01-05 LAB — T4, FREE: Free T4: 0.79 ng/dL (ref 0.61–1.12)

## 2021-01-05 LAB — RPR
RPR Ser Ql: REACTIVE — AB
RPR Titer: 1:64 {titer}

## 2021-01-05 LAB — SARS CORONAVIRUS 2 (TAT 6-24 HRS): SARS Coronavirus 2: NEGATIVE

## 2021-01-05 MED ORDER — LORAZEPAM 2 MG/ML IJ SOLN
2.0000 mg | Freq: Four times a day (QID) | INTRAMUSCULAR | Status: DC | PRN
  Administered 2021-01-06: 2 mg via INTRAVENOUS
  Filled 2021-01-05: qty 1

## 2021-01-05 MED ORDER — LEVETIRACETAM 750 MG PO TABS
750.0000 mg | ORAL_TABLET | Freq: Two times a day (BID) | ORAL | Status: DC
  Administered 2021-01-05 (×2): 750 mg via ORAL
  Filled 2021-01-05 (×3): qty 1

## 2021-01-05 MED ORDER — LACOSAMIDE 50 MG PO TABS
200.0000 mg | ORAL_TABLET | Freq: Two times a day (BID) | ORAL | Status: DC
  Administered 2021-01-05: 200 mg via ORAL
  Filled 2021-01-05: qty 4

## 2021-01-05 NOTE — ED Notes (Signed)
Attempted to give report, nurse unavailable, floor to call back.

## 2021-01-05 NOTE — ED Notes (Signed)
Tele  Breakfast Ordered 

## 2021-01-05 NOTE — ED Notes (Signed)
EEG teck placing leads at bedside. 2 GC sheriffs at bedside.
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
## 2021-01-05 NOTE — Progress Notes (Signed)
Pt arrived on unit. Oriented to the floor bed to the lowest positions with the wheels locked. Call light within reach.

## 2021-01-05 NOTE — Progress Notes (Signed)
LTM started; no initial skin breakdown; tested event button, nurses and officers educated in event button.

## 2021-01-05 NOTE — ED Notes (Signed)
Pt requesting iv be removed and new line be placed on forearm at this time due to discomfort. SKIN WNL, Pt states iv in [facility] was uncomfortable.

## 2021-01-05 NOTE — ED Notes (Signed)
[employer] officer alerted this RN and night shift RN that pt started seizing. Pt observed to have full tonic clonic movement for about 1 minute. Airway maintained, turned pt on side and placed 15 L of O2 via NRB and administered 2 mg of Ativan during seizure. Seizure activity stopped after one minute. Pt now in post ictal state. Dr. Claudette Laws notified. No new orders at this time. Will continue to monitor.
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
## 2021-01-05 NOTE — ED Notes (Signed)
Pt given graham crackers and peanut butter.
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
## 2021-01-06 DIAGNOSIS — A5149 Other secondary syphilitic conditions: Secondary | ICD-10-CM

## 2021-01-06 DIAGNOSIS — H538 Other visual disturbances: Secondary | ICD-10-CM | POA: Diagnosis not present

## 2021-01-06 DIAGNOSIS — G40409 Other generalized epilepsy and epileptic syndromes, not intractable, without status epilepticus: Principal | ICD-10-CM

## 2021-01-06 DIAGNOSIS — R569 Unspecified convulsions: Secondary | ICD-10-CM

## 2021-01-06 MED ORDER — FENTANYL CITRATE (PF) 100 MCG/2ML IJ SOLN: 50.0000 ug | Freq: Once | INTRAMUSCULAR | Status: DC

## 2021-01-06 MED ORDER — ALBUTEROL SULFATE HFA 108 (90 BASE) MCG/ACT IN AERS
2.0000 | INHALATION_SPRAY | Freq: Two times a day (BID) | RESPIRATORY_TRACT | Status: DC | PRN
  Filled 2021-01-06: qty 6.7
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
## 2021-01-06 NOTE — Progress Notes (Signed)
Spoke with Dr. Thomasena Edis; continue to monitor and maintain safety; patient isn't responding to verbal or tactal stimulus at this time; airway is not compromised; oxygen in place at 2 liters; sats are 100%.

## 2021-01-06 NOTE — Progress Notes (Addendum)
Called to room by officer at bedside; patient was watching tv and stopped began to turn to the right side and shaking all over; not answering to verbal response;had spitting and drooling from the mouth;episode lasted from 1832-1836 pm; he then began to respond with eye opening; swinging his arm up and repeat ly saying he is hot.  Safety maintained and MD paged with this event.  Patient is c/o headache presently; attempting to reorient to situation.

## 2021-01-06 NOTE — Progress Notes (Signed)
Brief neurology progress note  35yo M with seizure-like episodes undergoing long-term EEG monitoring for event characterization. Prior EEG 11/20/2020 was within normal limits, no seizures or epileptiform discharges were seen and one typical event recorded without concomitant EEG change and was a Non-epileptic event.   EEG from 01/05/2021 to 01/06/2021 was within normal limits. No seizures or epileptiform discharges were seen throughout the recording. One event was recorded on 01/06/2021 at 0058 as described above without concomitant eeg change and was NON epileptic event.  Plan is to continue holding medications and capture several more typical events.   Marisue Humble, MD Page: [phone]

## 2021-01-06 NOTE — Progress Notes (Signed)
Patient attempting to reorient; confused to place.

## 2021-01-06 NOTE — Progress Notes (Addendum)
Pt had tonic clonic like seizure activity that lasted 5 minutes, from 0058 until 0103. 2mg  IV Ativian given, During seizure patient spit clear liquid from mouth, teeth clenched, pupils equal and reactive to light, eyes stared straight ahead. Staff turned patient onto side and attempted to suction. Patient did not have incontinence.  Post seizure: Patient stated through clenched teeth,"I can't see anything. I can't move my face." He then stared at the ceiling and pointed his right index finger in the air as if he was unaware of the activity of his hand. VS were stable. After several minutes, patient asked for soda, and then began conversing normally. MD notified.

## 2021-01-06 NOTE — Progress Notes (Signed)
Returned to baseline

## 2021-01-06 NOTE — Progress Notes (Signed)
LTM maint complete - no skin breakdown under:  Fp2 A1 A2

## 2021-01-06 NOTE — Progress Notes (Signed)
beginning to arouse at this time; safety maintained.

## 2021-01-06 NOTE — Procedures (Addendum)
Patient Name: John Flynn  MRN: [id number]  Epilepsy Attending: Charlsie Quest  Referring Physician/Provider: Dr Lindie Spruce Duration: 01/05/2021 1254 to 01/06/2021 1254  Patient history: 35 yo M with seizure like episodes. EEG to evaluate for seizure  Level of alertness: Awake,asleep  AEDs during EEG study: None  Technical aspects: This EEG study was done with scalp electrodes positioned according to the 10-20 International system of electrode placement. Electrical activity was acquired at a sampling rate of 500Hz  and reviewed with a high frequency filter of 70Hz  and a low frequency filter of 1Hz . EEG data were recorded continuously and digitally stored.   Description: The posterior dominant rhythm consists of 9-10 Hz activity of moderate voltage (25-35 uV) seen predominantly in posterior head regions, symmetric and reactive to eye opening and eye closing.  Sleep was characterized by vertex waves, sleep spindles (12 to 14 Hz), maximal frontocentral region.   One event was recorded on 01/06/2021 at 0058 There was error in video but per nursing description, patient has at tonic clonic like seizure activity that lasted 5 minutes, from 0058 until 0103. During seizure patient spit clear liquid from mouth, teeth clenched, pupils equal and reactive to light, eyes stared straight ahead. Staff turned patient onto side and attempted to suction. Patient did not have incontinence.  Post seizure: Patient stated through clenched teeth,"I can't see anything. I can't move my face." He then stared at the ceiling and pointed his right index finger in the air as if he was unaware of the activity of his hand. VS were stable. After several minutes, patient asked for soda, and then began conversing normally. Concomitant eeg before, during and after the event didn't show any eeg change to suggest seizure  IMPRESSION: This study is within normal limits. No seizures or epileptiform discharges were seen throughout the  recording.  One event was recorded on 01/06/2021 at 0058 as described above without concomitant eeg change and was NON epileptic event.  Juliany Daughety 03/06/2021

## 2021-01-06 NOTE — Progress Notes (Signed)
Another episode; patient stopped speaking; closed his eyes and has tremors of his upper boby; start was 1850.

## 2021-01-07 DIAGNOSIS — G40909 Epilepsy, unspecified, not intractable, without status epilepticus: Secondary | ICD-10-CM | POA: Diagnosis not present

## 2021-01-07 DIAGNOSIS — R569 Unspecified convulsions: Secondary | ICD-10-CM | POA: Diagnosis not present

## 2021-01-07 DIAGNOSIS — R519 Headache, unspecified: Secondary | ICD-10-CM | POA: Diagnosis not present

## 2021-01-07 DIAGNOSIS — F445 Conversion disorder with seizures or convulsions: Secondary | ICD-10-CM

## 2021-01-07 DIAGNOSIS — A5149 Other secondary syphilitic conditions: Secondary | ICD-10-CM | POA: Diagnosis not present

## 2021-01-07 DIAGNOSIS — H538 Other visual disturbances: Secondary | ICD-10-CM | POA: Diagnosis not present

## 2021-01-07 LAB — BASIC METABOLIC PANEL
Anion gap: 8 (ref 5–15)
BUN: 14 mg/dL (ref 6–20)
CO2: 26 mmol/L (ref 22–32)
Calcium: 9.2 mg/dL (ref 8.9–10.3)
Chloride: 104 mmol/L (ref 98–111)
Creatinine, Ser: 1.12 mg/dL (ref 0.61–1.24)
GFR, Estimated: >60 mL/min (ref >60)
Glucose, Bld: 103 mg/dL — ABNORMAL HIGH (ref 70–99)
Potassium: 4.5 mmol/L (ref 3.5–5.1)
Sodium: 138 mmol/L (ref 135–145)

## 2021-01-07 LAB — CSF CELL COUNT WITH DIFFERENTIAL
RBC Count, CSF: 0 /mm3
RBC Count, CSF: 0 /mm3
Tube #: 1
Tube #: 3
WBC, CSF: 1 /mm3 (ref 0–5)
WBC, CSF: 1 /mm3 (ref 0–5)

## 2021-01-07 LAB — PROTEIN AND GLUCOSE, CSF
Glucose, CSF: 67 mg/dL (ref 40–70)
Total  Protein, CSF: 34 mg/dL (ref 15–45)

## 2021-01-07 LAB — CBC
HCT: 47 % (ref 39.0–52.0)
Hemoglobin: 15.5 g/dL (ref 13.0–17.0)
MCH: 28 pg (ref 26.0–34.0)
MCHC: 33 g/dL (ref 30.0–36.0)
MCV: 84.8 fL (ref 80.0–100.0)
Platelets: 286 10*3/uL (ref 150–400)
RBC: 5.54 MIL/uL (ref 4.22–5.81)
RDW: 13.2 % (ref 11.5–15.5)
WBC: 8.4 10*3/uL (ref 4.0–10.5)
nRBC: 0 % (ref 0.0–0.2)

## 2021-01-07 LAB — SEDIMENTATION RATE: Sed Rate: 1 mm/hr (ref 0–16)

## 2021-01-07 LAB — C-REACTIVE PROTEIN: CRP: 1 mg/dL — ABNORMAL HIGH (ref <1.0)

## 2021-01-07 MED ORDER — HYDROCORTISONE 1 % EX OINT
TOPICAL_OINTMENT | Freq: Once | CUTANEOUS | Status: AC
  Filled 2021-01-07: qty 28

## 2021-01-07 MED ORDER — HYDROCORTISONE 0.5 % EX OINT
TOPICAL_OINTMENT | Freq: Once | CUTANEOUS | Status: DC
  Filled 2021-01-07: qty 28.35

## 2021-01-07 MED ORDER — CAMPHOR-MENTHOL 0.5-0.5 % EX LOTN
TOPICAL_LOTION | CUTANEOUS | Status: DC | PRN
  Filled 2021-01-07: qty 222

## 2021-01-07 NOTE — Procedures (Signed)
Lumbar Puncture Procedure Note  Pre-operative Diagnosis:  Seizure disorder  Indications: Seizures, headache  Procedure Details:  Informed consent was obtained after explanation of the risks and benefits of the procedure, refer to the consent documentation.  Time-out performed immediately prior to the procedure.  Patient was placed in the upright position  The superior aspect of the iliac crests were identified, with the traverse demarcating the L4-L5 interspace.  A bedside ultrasound was used to help identify the spinous processes at L3 - L4 and the intervertebral space was located and marked.  This area was prepped and draped in the usual sterile fashion. Maximum sterile technique was used including antiseptics, cap, gloves, gown, hand hygiene, mask, and sterile sheet.  Local anesthesia with 1% lidocaine was applied subcutaneously then deep to the skin. The spinal needle with trocar was introduced with frequent removal of the trocar to evaluate for cerebrospinal fluid. When this fluid was noted an opening pressure was obtained and samples were collected in four separate tubes and sent to the lab after proper labeling. The spinal needle with trocar was removed, with minimal bleeding noted upon removal. A sterile bandage was placed over the puncture site after holding pressure.  A spinal needle was inserted at the L3 - L4 interspace.   Findings: 12-14 mL of clear spinal fluid was obtained. Tube 1 was sent for CSF cell count w/ diff Tube 2 was sent for CSF culture and gram stain Tube 3 was sent for CSF protein and glucose Tube 4 was sent for CSF VDRF        Condition:   The patient tolerated the procedure well and remains in the same condition as pre-procedure.  Complications: None; patient tolerated the procedure well.  Plan: Pt to remain supine for 1 hour.
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
## 2021-01-07 NOTE — Progress Notes (Signed)
Delayed entry LTM EEG discontinued - no skin breakdown at [facility]. Patient do have small bumps on left side of face , under eyes,  Forehead and chest area where eeg ecg leads where placed. Pt also complained of icty scalp and burning of scalp during unhook. Patient stated he has really sensitive skin.  Nurse notified

## 2021-01-07 NOTE — Progress Notes (Addendum)
Brief neurology progress note    Summary of EEG/clinical events:  One event was recorded on 01/06/2021 at 1832 and on 01/07/2021 at 0627. There was again error in video but per nursing description, patient was watching tv and stopped began to turn to the right side and shaking all over; not answering to verbal response;had spitting and drooling from the mouth;episode lasted from 1832-1836 pm; he then began to respond with eye opening; swinging his arm up and repeat ly saying he is hot. Concomitant eeg before, during and after the event didn't show any eeg change to suggest seizure  One event was recorded on 01/06/2021 at 1850. Per nursing description, patient stopped speaking; closed his eyes and has tremors of his upper body, remain confused till 1915. Concomitant eeg before, during and after the event didn't show any eeg change to suggest seizure  One event was recorded on 01/07/2021 at 10627. Per nursing description, came in to find patient shaking in the torso area, mouth clenched shut, eyes staring straight ahead, spitting onto mattress, not responsive to RN. Pt turned on side, O2 was 95% on RA, HR was 67 while still slightly shaking. After 6 minutes, patient became somnolent, but able to respond through clenched teeth. Stated he was sleepy Concomitant eeg before, during and after the event didn't show any eeg change to suggest seizure   Assessment and plan: 64y M with seizure-like episodes undergoing long-term EEG monitoring for event characterization. Prior EEG 11/20/2020 was within normal limits, no seizures or epileptiform discharges were seen. During this admission 4 of the patient's typical events as described above were captured which were non-epileptic events.    It is highly doubtful this patient has epilepsy. However, given number of events, will leave on low dose Keppra 500mg  po q12 hours. No need for more than 1 AED.   Primary team: if there is any way to notify jail that patient does  not need to return to hospital unless he does not return to baseline within one hour after an event or if they are concerned he has any other systemic injury. Also, please leave a note in Epic stating that if patient does come in again for seizure like activity, not to administer Benzos unless the episode is over 15 mins long.   , MD Neurology Page: Marisue Humble

## 2021-01-07 NOTE — Progress Notes (Signed)
Called to pt room due to report of patient having seizure. Came in to find patient shaking in the torso area, mouth clenched shut, eyes staring straight ahead, spitting onto mattress, not responsive to RN. Pt turned on side, O2 was 95% on RA, HR was 67 while still slightly shaking. After 6 minutes, patient became somnolent, but able to respond through clenched teeth. Stated he was sleepy. Monitoring patient at bedside until dayshift arrives. MD notified, NNO.

## 2021-01-07 NOTE — Progress Notes (Signed)
   01/07/21 2100  Provider Notification  Provider Name/Title Intern On Call  Date Provider Notified 01/07/21  Time Provider Notified 2105  Notification Type Page  Notification Reason Other (Comment) (pt c/o of rash in face)  Response Other (Comment) (Promise to come see pt)  Date of Provider Response 01/07/21  Time of Provider Response 2108

## 2021-01-07 NOTE — Procedures (Addendum)
Patient Name: John Flynn  MRN: [id number]  Epilepsy Attending: Charlsie Quest  Referring Physician/Provider: Dr Lindie Spruce Duration: 01/06/2021 1254 to 01/07/2021 1343  Patient history: 35 yo M with seizure like episodes. EEG to evaluate for seizure  Level of alertness: Awake,asleep  AEDs during EEG study: None  Technical aspects: This EEG study was done with scalp electrodes positioned according to the 10-20 International system of electrode placement. Electrical activity was acquired at a sampling rate of 500Hz  and reviewed with a high frequency filter of 70Hz  and a low frequency filter of 1Hz . EEG data were recorded continuously and digitally stored.   Description: The posterior dominant rhythm consists of 9-10 Hz activity of moderate voltage (25-35 uV) seen predominantly in posterior head regions, symmetric and reactive to eye opening and eye closing.  Sleep was characterized by vertex waves, sleep spindles (12 to 14 Hz), maximal frontocentral region.   One event was recorded on 01/06/2021 at 1832 and on 01/07/2021 at 0627. There was again error in video but per nursing description, patient was watching tv and stopped began to turn to the right side and shaking all over; not answering to verbal response;had spitting and drooling from the mouth;episode lasted from 1832-1836 pm; he then began to respond with eye opening; swinging his arm up and repeat ly saying he is hot.  Concomitant eeg before, during and after the event didn't show any eeg change to suggest seizure  One event was recorded on 01/06/2021 at 1850. Per nursing description, patient stopped speaking; closed his eyes and has tremors of his upper body, remain confused till 1915. Concomitant eeg before, during and after the event didn't show any eeg change to suggest seizure  One event was recorded on 01/07/2021 at 10627. Per nursing description, came in to find patient shaking in the torso area, mouth clenched shut, eyes  staring straight ahead, spitting onto mattress, not responsive to RN. Pt turned on side, O2 was 95% on RA, HR was 67 while still slightly shaking. After 6 minutes, patient became somnolent, but able to respond through clenched teeth. Stated he was sleepy Concomitant eeg before, during and after the event didn't show any eeg change to suggest seizure  IMPRESSION: This study is within normal limits. No seizures or epileptiform discharges were seen throughout the recording.  Three events were recorded described above without concomitant eeg change and were NON epileptic event.  Adellyn Capek 03/06/2021

## 2021-01-08 ENCOUNTER — Other Ambulatory Visit (HOSPITAL_COMMUNITY): Payer: Self-pay | Admitting: Student

## 2021-01-08 DIAGNOSIS — R569 Unspecified convulsions: Secondary | ICD-10-CM | POA: Diagnosis not present

## 2021-01-08 LAB — CBC
HCT: 47.7 % (ref 39.0–52.0)
Hemoglobin: 16 g/dL (ref 13.0–17.0)
MCH: 28.3 pg (ref 26.0–34.0)
MCHC: 33.5 g/dL (ref 30.0–36.0)
MCV: 84.3 fL (ref 80.0–100.0)
Platelets: 291 10*3/uL (ref 150–400)
RBC: 5.66 MIL/uL (ref 4.22–5.81)
RDW: 13.3 % (ref 11.5–15.5)
WBC: 6.5 10*3/uL (ref 4.0–10.5)
nRBC: 0 % (ref 0.0–0.2)

## 2021-01-08 LAB — BASIC METABOLIC PANEL
Anion gap: 6 (ref 5–15)
BUN: 13 mg/dL (ref 6–20)
CO2: 28 mmol/L (ref 22–32)
Calcium: 9.1 mg/dL (ref 8.9–10.3)
Chloride: 104 mmol/L (ref 98–111)
Creatinine, Ser: 1.17 mg/dL (ref 0.61–1.24)
GFR, Estimated: >60 mL/min (ref >60)
Glucose, Bld: 102 mg/dL — ABNORMAL HIGH (ref 70–99)
Potassium: 4.1 mmol/L (ref 3.5–5.1)
Sodium: 138 mmol/L (ref 135–145)

## 2021-01-08 MED ORDER — LEVETIRACETAM 500 MG PO TABS: 500.0000 mg | ORAL_TABLET | Freq: Two times a day (BID) | ORAL | 0 refills | Status: DC

## 2021-01-08 NOTE — Progress Notes (Signed)
Pt discharged into police custody at this time.  No complaints verbalized. Both patient and police officer understand all discharge instructions including prescription for keppra.  RX in packet and in officers possession.

## 2021-01-08 NOTE — Discharge Summary (Addendum)
Name: John Flynn MRN: [id number] DOB: 08-13-1986 35 y.o. PCP: Patient, No Pcp Per  Date of Admission: 01/04/2021  2:40 PM Date of Discharge:  01/08/2021 Attending Physician: Reymundo Poll, MD  Subjective: Patient evaluated at bedside during AM of discharge. States he is doing well this morning, says he has not had any further seizure-like episodes. Discussed results of LT-EEG as well as CSF studies. Explained we are still waiting for syphilis CSF lab, but otherwise studies have been normal. Patient verbalized understanding and did not have further questions.  Discharge Diagnosis: 1. Non-epileptic seizures 2. Headaches, visual disturbances in setting of recent syphilis infection  Discharge Medications: Allergies as of 01/08/2021       Reactions   Eggs Or Egg-derived Products Anaphylaxis   Shock Syndrome   Influenza Virus Vaccine Anaphylaxis   Aspirin    Toxic Shock Syndrome   Nsaids    Toxic Shock Syndrome        Medication List     STOP taking these medications    doxycycline 100 MG tablet Commonly known as: VIBRA-TABS   lacosamide 200 MG Tabs tablet Commonly known as: VIMPAT       TAKE these medications    albuterol 108 (90 Base) MCG/ACT inhaler Commonly known as: VENTOLIN HFA Inhale 2 puffs into the lungs 2 (two) times daily as needed for wheezing or shortness of breath.   apixaban 5 MG Tabs tablet Commonly known as: ELIQUIS Take 5 mg by mouth 2 (two) times daily.   ciclesonide 80 MCG/ACT inhaler Commonly known as: ALVESCO Inhale 1 puff into the lungs 2 (two) times daily.   gemfibrozil 600 MG tablet Commonly known as: LOPID Take 600 mg by mouth 2 (two) times daily before a meal.   levETIRAcetam 500 MG tablet Commonly known as: KEPPRA Take 1 tablet (500 mg total) by mouth 2 (two) times daily. What changed:  medication strength how much to take        Disposition and follow-up:   Mr.Kohner Nistler was discharged from [facility] in Stable condition.  At the hospital follow up visit please address:  1. Non-epileptic seizures: Multiple seizure-like episodes on EEG while hospitalized. Per neurology, no epileptic changes. Therefore, recommend starting Keppra 500mg  BID.  Per Neurology / Epileptology recommendations:   *If patient has further seizure-like activity, he will not need to return to the Emergency Department unless he does not return to his baseline within an hour.*  *If patient has further seizure-like activity, only administer Ativan if the episode lasts longer than 15 minutes.*  2. Headaches, visual disturbances in setting of recent syphilis infection: LP performed while hospitalized to rule out neurosyphilis. Pending VDRL CSF results, will call the jail with results.  2.  Labs / imaging needed at time of follow-up: n/a  3.  Pending labs/ test needing follow-up: CSF VDRL  Follow-up Appointments:     Follow-up Information     Primary Care Physician Follow up.   Why: Please make sure to follow up with the physician in the facility.                Hospital Course by problem list: 1. Non-epileptic seizures: Patient presented with seizure like activity, similar to previous presentations. During hospital course, neurology consulted. Patient was taken off his home AED's and placed on long-term EEG. Over the course of 48 hours patient had multiple seizure-like episodes. These were reviewed by the neurologists and reported no epileptic activity. Therefore, plan on discharging patient  on Keppra 500mg  BID. At this point, neurologists do not feel he needs more than one AED. In addition, patient will not need to return to the Emergency Department for seizures unless he does not return to his baseline within an hour. In addition, Ativan will not be needed unless the episode lasts longer than 15 minutes.   2. Headaches, visual disturbances in setting of recent syphilis infection: Patient reports  confirmed positive syphilis titers on 1/23 with subsequent treatment of one dose of IM penicillin G on 1/23. It is unclear how long the patient had syphilis prior to this. He reports intermittent headaches and vision changes recently as well. Given unknown chronicity of syphilis infection and new neurological symptoms, performed LP to rule out neurosyphilis. At discharge, CSF cell count, glucose, and protein all resulted normal. CSF VDRL still pending, will call the jail with results.  Discharge Exam:   BP 116/83 (BP Location: Right Arm)   Pulse 63   Temp 98.5 F (36.9 C) (Oral)   Resp 18   SpO2 97%  General: Pleasant, laying in bed, no acute distress CV: Regular rate, rhythm. No m/r/g Pulm: Clear to auscultation bilaterally. No wheezing, rhonchi, rales Neuro: Awake, alert, oriented x4. Moving extremities appropriately. Psych: Normal mood, affect, speech  Pertinent Labs, Studies, and Procedures:  CBC Latest Ref Rng & Units 01/08/2021 01/07/2021 01/04/2021  WBC 4.0 - 10.5 K/uL 6.5 8.4 4.4  Hemoglobin 13.0 - 17.0 g/dL 03/04/2021 40.9 81.1  Hematocrit 39.0 - 52.0 % 47.7 47.0 46.1  Platelets 150 - 400 K/uL 291 286 282   BMP Latest Ref Rng & Units 01/08/2021 01/07/2021 01/04/2021  Glucose 70 - 99 mg/dL 03/04/2021) 782(N) 562(Z)  BUN 6 - 20 mg/dL 13 14 8   Creatinine 0.61 - 1.24 mg/dL 308(M 5.78  Sodium 135 - 145 mmol/L 138 138 137  Potassium 3.5 - 5.1 mmol/L 4.1 4.5 4.0  Chloride 98 - 111 mmol/L 104 104 103  CO2 22 - 32 mmol/L 28 26 26   Calcium 8.9 - 10.3 mg/dL 9.1 9.2 9.3   LP performed 01/07/2021  Discharge Instructions:   Mr. Dutton, I am glad you are feeling better and are ready for discharge from the hospital! During your stay, we monitored your seizures for epilepsy. We did not find any epilepsy changes during any of these episodes. Therefore, moving forward, you will only come back to the Emergency Room for a seizure if you do not return to baseline within an hour. In addition, we will have you only  taking Keppra, 500mg  twice daily. You will not need to take any other seizure medication. In addition, we performed a lumbar puncture to collect fluid from your spinal canal. We are still waiting for the syphilis test to return. If positive, you will need to be treated with more antibiotics. We will call the jail with the results.  It was a pleasure meeting you, Mr. Moise. I wish you the best and hope you stay happy and healthy!  Thank you, 03/07/2021, MD  Signed: Massie Maroon, MD 01/08/2021, 8:11 AM   Pager: [phone]

## 2021-01-08 NOTE — Progress Notes (Signed)
Pt with no complaints; no seizure activity thus far.  Pt in custody of police who is at bedside with pt in ankle shackles and cuffed to bed.  Alert and oriented.

## 2021-01-08 NOTE — Discharge Instructions (Signed)
Information on my medicine - ELIQUIS (apixaban)  This medication education was reviewed with me or my healthcare representative as part of my discharge preparation.  The pharmacist that spoke with me during my hospital stay was:  Eino Whitner, RPH-CPP  Why was Eliquis prescribed for you? Eliquis was prescribed to treat blood clots that may have been found in the veins of your legs (deep vein thrombosis) or in your lungs (pulmonary embolism) and to reduce the risk of them occurring again.  What do You need to know about Eliquis ? Continue Eliquis 5mg TWICE daily.  Eliquis may be taken with or without food.   Try to take the dose about the same time in the morning and in the evening. If you have difficulty swallowing the tablet whole please discuss with your pharmacist how to take the medication safely.  Take Eliquis exactly as prescribed and DO NOT stop taking Eliquis without talking to the doctor who prescribed the medication.  Stopping may increase your risk of developing a new blood clot.  Refill your prescription before you run out.  After discharge, you should have regular check-up appointments with your healthcare provider that is prescribing your Eliquis.    What do you do if you miss a dose? If a dose of ELIQUIS is not taken at the scheduled time, take it as soon as possible on the same day and twice-daily administration should be resumed. The dose should not be doubled to make up for a missed dose.  Important Safety Information A possible side effect of Eliquis is bleeding. You should call your healthcare provider right away if you experience any of the following: ? Bleeding from an injury or your nose that does not stop. ? Unusual colored urine (red or dark brown) or unusual colored stools (red or black). ? Unusual bruising for unknown reasons. ? A serious fall or if you hit your head (even if there is no bleeding).  Some medicines may interact with Eliquis and might  increase your risk of bleeding or clotting while on Eliquis. To help avoid this, consult your healthcare provider or pharmacist prior to using any new prescription or non-prescription medications, including herbals, vitamins, non-steroidal anti-inflammatory drugs (NSAIDs) and supplements.  This website has more information on Eliquis (apixaban): http://www.eliquis.com/eliquis/home   

## 2021-01-08 NOTE — TOC Transition Note (Signed)
Transition of Care [facility]) - CM/SW Discharge Note   Patient Details  Name: John Flynn MRN: [id number] Date of Birth: Jul 30, 1986  [facility] CM/SW Contact:  Kermit Balo, RN Phone Number: 01/08/2021, 2:15 PM   Clinical Narrative:    Pt is returning to [place] today. Prescription for new keppra dose provided to bedside RN.  Pt has transportation via sherriffs department.   Final next level of care: [facility] Barriers to Discharge: No Barriers Identified   Patient Goals and CMS Choice        Discharge Placement                       Discharge Plan and Services                                     Social Determinants of Health (SDOH) Interventions     Readmission Risk Interventions No flowsheet data found.

## 2021-01-09 LAB — VDRL, CSF: VDRL Quant, CSF: NONREACTIVE

## 2021-01-10 LAB — CSF CULTURE W GRAM STAIN: Culture: NO GROWTH

## 2021-01-10 LAB — T.PALLIDUM AB, TOTAL: T Pallidum Abs: REACTIVE — AB

## 2022-01-16 ENCOUNTER — Other Ambulatory Visit: Payer: Self-pay

## 2022-01-16 ENCOUNTER — Emergency Department (HOSPITAL_COMMUNITY)
Admission: EM | Admit: 2022-01-16 | Discharge: 2022-01-17 | Disposition: A | Discharge status: Home / Self Care | Attending: Emergency Medicine | Admitting: Emergency Medicine

## 2022-01-16 ENCOUNTER — Emergency Department (HOSPITAL_COMMUNITY)

## 2022-01-16 DIAGNOSIS — R569 Unspecified convulsions: Secondary | ICD-10-CM | POA: Insufficient documentation

## 2022-01-16 DIAGNOSIS — R4781 Slurred speech: Secondary | ICD-10-CM | POA: Insufficient documentation

## 2022-01-16 DIAGNOSIS — Z7901 Long term (current) use of anticoagulants: Secondary | ICD-10-CM | POA: Insufficient documentation

## 2022-01-16 LAB — CBG MONITORING, ED: Glucose-Capillary: 111 mg/dL — ABNORMAL HIGH (ref 70–99)

## 2022-01-16 MED ORDER — LEVETIRACETAM 500 MG PO TABS: 500.0000 mg | ORAL_TABLET | Freq: Once | ORAL | Status: DC

## 2022-01-16 NOTE — ED Notes (Signed)
Called ex wife at [phone] and she is calling a family member to come get patient.
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
## 2022-01-16 NOTE — Discharge Instructions (Signed)
Call your primary care doctor or specialist as discussed in the next 2-3 days.   Return immediately back to the ER if:  Your symptoms worsen within the next 12-24 hours. You develop new symptoms such as new fevers, persistent vomiting, new pain, shortness of breath, or new weakness or numbness, or if you have any other concerns.  SEIZURE PRECAUTIONS Per [facility] statutes, patients with seizures are not allowed to drive until they have been seizure-free for six months.   Use caution when using heavy equipment or power tools. Avoid working on ladders or at heights. Take showers instead of baths. Ensure the water temperature is not too high on the home water heater. Do not go swimming alone. Do not lock yourself in a room alone (i.e. bathroom). When caring for infants or small children, sit down when holding, feeding, or changing them to minimize risk of injury to the child in the event you have a seizure. Maintain good sleep hygiene. Avoid alcohol.   If patient has another seizure, call 911 and bring them back to the ED if: A.  The seizure lasts longer than 5 minutes.      B.  The patient doesn't wake shortly after the seizure or has new problems such as difficulty seeing, speaking or moving following the seizure C.  The patient was injured during the seizure D.  The patient has a temperature over 102 F (39C) E.  The patient vomited during the seizure and now is having trouble breathing

## 2022-01-16 NOTE — ED Notes (Addendum)
RN attempted to start IV with pts approval. During attempt pt pulled his arm away and told RN to stop and did not want an IV. RN removed IV catheter at pts request.  Pt refused blood work at this time.

## 2022-01-16 NOTE — ED Triage Notes (Signed)
Patient came from [facility] with possible seizures but [facility] states that it is possible ticks. Bp-150/90 94-hr 98-room 116-bs.

## 2022-01-16 NOTE — ED Provider Triage Note (Signed)
Emergency Medicine Provider Triage Evaluation Note  John Flynn , a 36 y.o. male  was evaluated in triage.  Patient unable to contribute to history.  Per EMS, they think he had a seizure.  Patient won't answer my questions, but is sitting up in the wheel chair looking at me.  Review of Systems  Positive: ?seizure Negative:   Physical Exam  BP (!) 158/108    Pulse (!) 112    Temp 99 F (37.2 C)    Resp 16    SpO2 100%  Gen:   Awake, no distress   Resp:  Normal effort  MSK:   Moves extremities without difficulty  Other:    Medical Decision Making  Medically screening exam initiated at 8:54 PM.  Appropriate orders placed.  John Flynn was informed that the remainder of the evaluation will be completed by another provider, this initial triage assessment does not replace that evaluation, and the importance of remaining in the ED until their evaluation is complete.  ?seizure   John Flynn, John Flynn 01/16/22 2055

## 2022-01-17 NOTE — ED Notes (Signed)
Called into room to assist with phone call. Spoke to someone named John Flynn and heard male in the background who said they would pick pt up in 45 minutes.

## 2022-01-17 NOTE — ED Notes (Signed)
Pt was informed that he would be in the ED until SW/CM could come see him in the morning for a consult. Pt asked RN multiple times to shut the door because he was scared of dogs and did not want them to get into his room. RN redirected pt and told pt that there were not any dogs in the hallway and none would be in his room. RN closed door at pts request. Pt watching tv on the stretcher at this time.

## 2022-01-17 NOTE — ED Notes (Signed)
RN asked if there was an emergency contact that could be reached when he was discharged. Per pt, he does not have anyone. Pt does not remember the events leading up to coming the hospital and unsure of why he is at the hospital.

## 2022-01-17 NOTE — ED Notes (Signed)
Pt left room and walked down hallway, cussing at staff. Discharge papers given, security escorted pt to lobby to await ride.

## 2022-01-17 NOTE — ED Provider Notes (Signed)
Patient was up for discharge and ex wife was called to pick him up.  States she was going to "send a family member", but no one has shown up.  Patient states he does not have anyone else to contact that could come get him.  He apparently came to the ED from [place] [address].  Will board patient in the ED, have TOC see in the morning.   Garlon Hatchet, PA-C 01/17/22 0206    Dione Booze, MD 01/17/22 [phone]

## 2022-05-21 ENCOUNTER — Encounter (HOSPITAL_COMMUNITY): Payer: Self-pay

## 2022-05-21 ENCOUNTER — Emergency Department (HOSPITAL_COMMUNITY)
Admission: EM | Admit: 2022-05-21 | Discharge: 2022-05-22 | Attending: Emergency Medicine | Admitting: Emergency Medicine

## 2022-05-21 ENCOUNTER — Emergency Department (HOSPITAL_COMMUNITY): Payer: Self-pay

## 2022-05-21 ENCOUNTER — Other Ambulatory Visit: Payer: Self-pay

## 2022-05-21 ENCOUNTER — Emergency Department (HOSPITAL_COMMUNITY)

## 2022-05-21 DIAGNOSIS — X58XXXA Exposure to other specified factors, initial encounter: Secondary | ICD-10-CM | POA: Diagnosis not present

## 2022-05-21 DIAGNOSIS — R569 Unspecified convulsions: Secondary | ICD-10-CM | POA: Insufficient documentation

## 2022-05-21 DIAGNOSIS — Z7901 Long term (current) use of anticoagulants: Secondary | ICD-10-CM | POA: Diagnosis not present

## 2022-05-21 DIAGNOSIS — J45909 Unspecified asthma, uncomplicated: Secondary | ICD-10-CM | POA: Insufficient documentation

## 2022-05-21 DIAGNOSIS — S8991XA Unspecified injury of right lower leg, initial encounter: Secondary | ICD-10-CM | POA: Diagnosis present

## 2022-05-21 DIAGNOSIS — G40909 Epilepsy, unspecified, not intractable, without status epilepticus: Secondary | ICD-10-CM

## 2022-05-21 DIAGNOSIS — I10 Essential (primary) hypertension: Secondary | ICD-10-CM | POA: Diagnosis not present

## 2022-05-21 DIAGNOSIS — S8001XA Contusion of right knee, initial encounter: Secondary | ICD-10-CM | POA: Insufficient documentation

## 2022-05-21 HISTORY — DX: Cerebral infarction, unspecified: I63.9

## 2022-05-21 HISTORY — DX: Unspecified asthma, uncomplicated: J45.909

## 2022-05-21 HISTORY — DX: Essential (primary) hypertension: I10

## 2022-05-21 LAB — COMPREHENSIVE METABOLIC PANEL
ALT: 22 U/L (ref 0–44)
AST: 20 U/L (ref 15–41)
Albumin: 3.7 g/dL (ref 3.5–5.0)
Alkaline Phosphatase: 60 U/L (ref 38–126)
Anion gap: 6 (ref 5–15)
BUN: 11 mg/dL (ref 6–20)
CO2: 27 mmol/L (ref 22–32)
Calcium: 9.1 mg/dL (ref 8.9–10.3)
Chloride: 106 mmol/L (ref 98–111)
Creatinine, Ser: 1.23 mg/dL (ref 0.61–1.24)
GFR, Estimated: >60 mL/min (ref >60)
Glucose, Bld: 94 mg/dL (ref 70–99)
Potassium: 4.1 mmol/L (ref 3.5–5.1)
Sodium: 139 mmol/L (ref 135–145)
Total Bilirubin: 0.7 mg/dL (ref 0.3–1.2)
Total Protein: 6.7 g/dL (ref 6.5–8.1)

## 2022-05-21 LAB — CBC WITH DIFFERENTIAL/PLATELET
Abs Immature Granulocytes: 0.03 10*3/uL (ref 0.00–0.07)
Basophils Absolute: 0 10*3/uL (ref 0.0–0.1)
Basophils Relative: 1 %
Eosinophils Absolute: 0.1 10*3/uL (ref 0.0–0.5)
Eosinophils Relative: 1 %
HCT: 50.1 % (ref 39.0–52.0)
Hemoglobin: 15.8 g/dL (ref 13.0–17.0)
Immature Granulocytes: 1 %
Lymphocytes Relative: 31 %
Lymphs Abs: 2 10*3/uL (ref 0.7–4.0)
MCH: 27.1 pg (ref 26.0–34.0)
MCHC: 31.5 g/dL (ref 30.0–36.0)
MCV: 85.9 fL (ref 80.0–100.0)
Monocytes Absolute: 0.4 10*3/uL (ref 0.1–1.0)
Monocytes Relative: 6 %
Neutro Abs: 4 10*3/uL (ref 1.7–7.7)
Neutrophils Relative %: 60 %
Platelets: 265 10*3/uL (ref 150–400)
RBC: 5.83 MIL/uL — ABNORMAL HIGH (ref 4.22–5.81)
RDW: 13.7 % (ref 11.5–15.5)
WBC: 6.5 10*3/uL (ref 4.0–10.5)
nRBC: 0 % (ref 0.0–0.2)

## 2022-05-21 MED ORDER — LORAZEPAM 2 MG/ML IJ SOLN
INTRAMUSCULAR | Status: AC
  Administered 2022-05-21: 1 mg via INTRAVENOUS
  Filled 2022-05-21: qty 1

## 2022-05-21 MED ORDER — LEVETIRACETAM IN NACL 1500 MG/100ML IV SOLN
1500.0000 mg | Freq: Once | INTRAVENOUS | Status: AC
  Administered 2022-05-21: 1500 mg via INTRAVENOUS
  Filled 2022-05-21: qty 100

## 2022-05-21 MED ORDER — LORAZEPAM 2 MG/ML IJ SOLN
1.0000 mg | Freq: Once | INTRAMUSCULAR | Status: AC
  Administered 2022-05-21: 1 mg via INTRAVENOUS
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
## 2022-05-21 NOTE — ED Triage Notes (Signed)
Pt to ED via EMS coming from jail. Pt had a 3 minute long lasting seizure at the jail tonight. Pt has hx of same. Pt on was post ictal when EMS arrived. Pt had a 2nd seizure with EMS lasting about 1.5 minutes. EMS gave 2.5 midazolam IV en route. Pt AAOx4 and on room air upon arrival to ED.   Pt has Hx asthma, seizures, DVTs, HTN, stroke within last 90 days. Pt on blood thinners.   EMS Vitals: 157/88 78 HR NSR 100% 3L [place] 113 CBG 20 LAC 2.5 midazolam IV

## 2022-05-22 NOTE — ED Notes (Signed)
Pt changed into paper scrubs. Pt given snacks and sprite. Pt being Dced in wheelchair with police.

## 2022-05-22 NOTE — Discharge Instructions (Addendum)
Continue keppra and vimpat. Return to the ED for new concerns.

## 2022-05-22 NOTE — ED Provider Notes (Signed)
Labs are reassuring without any profound anemia or electrolyte derangement.  CT head is negative.  Knee films also negative.  Patient very drowsy at this time after dose of Ativan.  Will monitor.  4:16 AM Patient reassessed--arouses appropriately.  Vitals are stable on room air.  Feels like he would like to eat.  At this point, feel he stable for discharge back to jail.  He can continue his usual doses of Vimpat and Keppra.  Can return here for new concerns.   Garlon Hatchet, PA-C 05/22/22 1610    Gilda Crease, MD 05/22/22 [phone]

## 2022-05-25 ENCOUNTER — Emergency Department (HOSPITAL_COMMUNITY)

## 2022-05-25 ENCOUNTER — Other Ambulatory Visit: Payer: Self-pay

## 2022-05-25 ENCOUNTER — Encounter (HOSPITAL_COMMUNITY): Payer: Self-pay

## 2022-05-25 ENCOUNTER — Emergency Department (HOSPITAL_COMMUNITY): Payer: Self-pay

## 2022-05-25 ENCOUNTER — Observation Stay (HOSPITAL_COMMUNITY): Payer: Self-pay

## 2022-05-25 ENCOUNTER — Observation Stay (HOSPITAL_COMMUNITY): Admission: EM | Admit: 2022-05-25 | Discharge: 2022-05-27 | Attending: Internal Medicine | Admitting: Internal Medicine

## 2022-05-25 ENCOUNTER — Inpatient Hospital Stay (HOSPITAL_COMMUNITY): Payer: Self-pay

## 2022-05-25 ENCOUNTER — Encounter (HOSPITAL_COMMUNITY): Payer: Self-pay | Admitting: Emergency Medicine

## 2022-05-25 DIAGNOSIS — Z96641 Presence of right artificial hip joint: Secondary | ICD-10-CM | POA: Diagnosis not present

## 2022-05-25 DIAGNOSIS — G40909 Epilepsy, unspecified, not intractable, without status epilepticus: Secondary | ICD-10-CM

## 2022-05-25 DIAGNOSIS — R569 Unspecified convulsions: Secondary | ICD-10-CM

## 2022-05-25 DIAGNOSIS — Z79899 Other long term (current) drug therapy: Secondary | ICD-10-CM | POA: Diagnosis not present

## 2022-05-25 DIAGNOSIS — F4310 Post-traumatic stress disorder, unspecified: Secondary | ICD-10-CM

## 2022-05-25 DIAGNOSIS — J45909 Unspecified asthma, uncomplicated: Secondary | ICD-10-CM | POA: Insufficient documentation

## 2022-05-25 DIAGNOSIS — R7989 Other specified abnormal findings of blood chemistry: Secondary | ICD-10-CM | POA: Insufficient documentation

## 2022-05-25 DIAGNOSIS — G40901 Epilepsy, unspecified, not intractable, with status epilepticus: Secondary | ICD-10-CM | POA: Diagnosis not present

## 2022-05-25 DIAGNOSIS — Z86711 Personal history of pulmonary embolism: Secondary | ICD-10-CM | POA: Insufficient documentation

## 2022-05-25 DIAGNOSIS — Z86718 Personal history of other venous thrombosis and embolism: Secondary | ICD-10-CM | POA: Diagnosis not present

## 2022-05-25 DIAGNOSIS — F17200 Nicotine dependence, unspecified, uncomplicated: Secondary | ICD-10-CM | POA: Insufficient documentation

## 2022-05-25 DIAGNOSIS — N179 Acute kidney failure, unspecified: Secondary | ICD-10-CM | POA: Diagnosis present

## 2022-05-25 DIAGNOSIS — Z7901 Long term (current) use of anticoagulants: Secondary | ICD-10-CM | POA: Diagnosis not present

## 2022-05-25 DIAGNOSIS — F6381 Intermittent explosive disorder: Secondary | ICD-10-CM

## 2022-05-25 LAB — CBC WITH DIFFERENTIAL/PLATELET
Abs Immature Granulocytes: 0.02 10*3/uL (ref 0.00–0.07)
Basophils Absolute: 0 10*3/uL (ref 0.0–0.1)
Basophils Relative: 1 %
Eosinophils Absolute: 0.1 10*3/uL (ref 0.0–0.5)
Eosinophils Relative: 1 %
HCT: 52.4 % — ABNORMAL HIGH (ref 39.0–52.0)
Hemoglobin: 16.4 g/dL (ref 13.0–17.0)
Immature Granulocytes: 0 %
Lymphocytes Relative: 39 %
Lymphs Abs: 2.5 10*3/uL (ref 0.7–4.0)
MCH: 27.2 pg (ref 26.0–34.0)
MCHC: 31.3 g/dL (ref 30.0–36.0)
MCV: 86.8 fL (ref 80.0–100.0)
Monocytes Absolute: 0.4 10*3/uL (ref 0.1–1.0)
Monocytes Relative: 6 %
Neutro Abs: 3.4 10*3/uL (ref 1.7–7.7)
Neutrophils Relative %: 53 %
Platelets: 247 10*3/uL (ref 150–400)
RBC: 6.04 MIL/uL — ABNORMAL HIGH (ref 4.22–5.81)
RDW: 13.6 % (ref 11.5–15.5)
WBC: 6.4 10*3/uL (ref 4.0–10.5)
nRBC: 0 % (ref 0.0–0.2)

## 2022-05-25 LAB — RAPID URINE DRUG SCREEN, HOSP PERFORMED
Amphetamines: NOT DETECTED
Barbiturates: NOT DETECTED
Benzodiazepines: POSITIVE — AB
Cocaine: NOT DETECTED
Opiates: NOT DETECTED
Tetrahydrocannabinol: NOT DETECTED

## 2022-05-25 LAB — BASIC METABOLIC PANEL
Anion gap: 8 (ref 5–15)
BUN: 10 mg/dL (ref 6–20)
CO2: 29 mmol/L (ref 22–32)
Calcium: 9.4 mg/dL (ref 8.9–10.3)
Chloride: 103 mmol/L (ref 98–111)
Creatinine, Ser: 1.37 mg/dL — ABNORMAL HIGH (ref 0.61–1.24)
GFR, Estimated: >60 mL/min (ref >60)
Glucose, Bld: 102 mg/dL — ABNORMAL HIGH (ref 70–99)
Potassium: 4 mmol/L (ref 3.5–5.1)
Sodium: 140 mmol/L (ref 135–145)

## 2022-05-25 LAB — CBG MONITORING, ED: Glucose-Capillary: 110 mg/dL — ABNORMAL HIGH (ref 70–99)

## 2022-05-25 MED ORDER — LEVETIRACETAM 500 MG/5ML IV SOLN
2000.0000 mg | Freq: Once | INTRAVENOUS | Status: DC
Start: 2022-05-25 — End: 2022-05-25

## 2022-05-25 MED ORDER — ACETAMINOPHEN 325 MG PO TABS: 650.0000 mg | ORAL_TABLET | Freq: Four times a day (QID) | ORAL | Status: DC | PRN

## 2022-05-25 MED ORDER — RIVAROXABAN 10 MG PO TABS
20.0000 mg | ORAL_TABLET | Freq: Every day | ORAL | Status: DC
  Administered 2022-05-25 – 2022-05-26 (×2): 20 mg via ORAL
  Filled 2022-05-25 (×3): qty 2

## 2022-05-25 MED ORDER — LEVETIRACETAM IN NACL 1000 MG/100ML IV SOLN
1000.0000 mg | Freq: Once | INTRAVENOUS | Status: AC
  Filled 2022-05-25: qty 100

## 2022-05-25 MED ORDER — SODIUM CHLORIDE 0.9 % IV SOLN
200.0000 mg | Freq: Two times a day (BID) | INTRAVENOUS | Status: DC
  Administered 2022-05-25 – 2022-05-27 (×4): 200 mg via INTRAVENOUS
  Filled 2022-05-25 (×5): qty 20

## 2022-05-25 MED ORDER — LACTATED RINGERS IV BOLUS
1000.0000 mL | Freq: Once | INTRAVENOUS | Status: AC
  Administered 2022-05-25: 1000 mL via INTRAVENOUS

## 2022-05-25 MED ORDER — LORAZEPAM 2 MG/ML IJ SOLN
2.0000 mg | Freq: Once | INTRAMUSCULAR | Status: AC
  Administered 2022-05-25: 2 mg via INTRAVENOUS

## 2022-05-25 MED ORDER — LEVETIRACETAM IN NACL 1000 MG/100ML IV SOLN
1000.0000 mg | Freq: Two times a day (BID) | INTRAVENOUS | Status: DC
  Administered 2022-05-26 – 2022-05-27 (×3): 1000 mg via INTRAVENOUS
  Filled 2022-05-25 (×3): qty 100

## 2022-05-25 MED ORDER — LEVETIRACETAM IN NACL 1000 MG/100ML IV SOLN
1000.0000 mg | Freq: Once | INTRAVENOUS | Status: AC
  Administered 2022-05-25: 1000 mg via INTRAVENOUS

## 2022-05-25 MED ORDER — ACETAMINOPHEN 650 MG RE SUPP: 650.0000 mg | Freq: Four times a day (QID) | RECTAL | Status: DC | PRN

## 2022-05-25 MED ORDER — LORAZEPAM 2 MG/ML IJ SOLN
INTRAMUSCULAR | Status: AC
Start: 2022-05-25 — End: 2022-05-25
  Filled 2022-05-25: qty 1
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
## 2022-05-25 NOTE — ED Triage Notes (Signed)
Patient BIB [facility] from jail after five witnessed seizures, each lasting approximately four minutes with one minute forty five seconds in between. During seizures patient fell and hit his head on a metal table, and then again on the floor, patient is on xarelto. Received 2.5 mg midazolam from EMS for seizure, 5mg  albuterol/0.5 atrovent for wheezing. Patient arrived to ED alert, asking questions about what happened. 20g saline lock in left proximal forearm.

## 2022-05-26 ENCOUNTER — Observation Stay (HOSPITAL_COMMUNITY): Payer: Self-pay

## 2022-05-26 ENCOUNTER — Encounter (HOSPITAL_COMMUNITY): Payer: Self-pay | Admitting: Internal Medicine

## 2022-05-26 ENCOUNTER — Other Ambulatory Visit: Payer: Self-pay

## 2022-05-26 DIAGNOSIS — F4310 Post-traumatic stress disorder, unspecified: Secondary | ICD-10-CM

## 2022-05-26 DIAGNOSIS — F445 Conversion disorder with seizures or convulsions: Secondary | ICD-10-CM

## 2022-05-26 DIAGNOSIS — G40901 Epilepsy, unspecified, not intractable, with status epilepticus: Secondary | ICD-10-CM | POA: Diagnosis not present

## 2022-05-26 DIAGNOSIS — F6381 Intermittent explosive disorder: Secondary | ICD-10-CM

## 2022-05-26 DIAGNOSIS — R569 Unspecified convulsions: Secondary | ICD-10-CM | POA: Diagnosis not present

## 2022-05-26 LAB — CBC
HCT: 49.8 % (ref 39.0–52.0)
Hemoglobin: 16.3 g/dL (ref 13.0–17.0)
MCH: 28.1 pg (ref 26.0–34.0)
MCHC: 32.7 g/dL (ref 30.0–36.0)
MCV: 85.7 fL (ref 80.0–100.0)
Platelets: 241 10*3/uL (ref 150–400)
RBC: 5.81 MIL/uL (ref 4.22–5.81)
RDW: 13.6 % (ref 11.5–15.5)
WBC: 8.5 10*3/uL (ref 4.0–10.5)
nRBC: 0 % (ref 0.0–0.2)

## 2022-05-26 LAB — BASIC METABOLIC PANEL
Anion gap: 7 (ref 5–15)
BUN: 11 mg/dL (ref 6–20)
CO2: 24 mmol/L (ref 22–32)
Calcium: 9.1 mg/dL (ref 8.9–10.3)
Chloride: 107 mmol/L (ref 98–111)
Creatinine, Ser: 1.22 mg/dL (ref 0.61–1.24)
GFR, Estimated: >60 mL/min (ref >60)
Glucose, Bld: 101 mg/dL — ABNORMAL HIGH (ref 70–99)
Potassium: 3.6 mmol/L (ref 3.5–5.1)
Sodium: 138 mmol/L (ref 135–145)

## 2022-05-26 LAB — HIV ANTIBODY (ROUTINE TESTING W REFLEX): HIV Screen 4th Generation wRfx: NONREACTIVE

## 2022-05-26 MED ORDER — MIRTAZAPINE 15 MG PO TABS
15.0000 mg | ORAL_TABLET | Freq: Every day | ORAL | Status: DC
  Administered 2022-05-26: 15 mg via ORAL
  Filled 2022-05-26: qty 1

## 2022-05-26 MED ORDER — GADOBUTROL 1 MMOL/ML IV SOLN
8.0000 mL | Freq: Once | INTRAVENOUS | Status: AC | PRN
  Administered 2022-05-26: 8 mL via INTRAVENOUS

## 2022-05-26 MED ORDER — ALBUTEROL SULFATE (2.5 MG/3ML) 0.083% IN NEBU
2.5000 mg | INHALATION_SOLUTION | RESPIRATORY_TRACT | Status: DC | PRN
  Administered 2022-05-27: 2.5 mg via RESPIRATORY_TRACT
  Filled 2022-05-26 (×2): qty 3
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
## 2022-05-27 DIAGNOSIS — Z86718 Personal history of other venous thrombosis and embolism: Secondary | ICD-10-CM | POA: Diagnosis not present

## 2022-05-27 DIAGNOSIS — F4310 Post-traumatic stress disorder, unspecified: Secondary | ICD-10-CM | POA: Diagnosis not present

## 2022-05-27 DIAGNOSIS — N179 Acute kidney failure, unspecified: Secondary | ICD-10-CM | POA: Diagnosis not present

## 2022-05-27 DIAGNOSIS — R569 Unspecified convulsions: Secondary | ICD-10-CM | POA: Diagnosis not present

## 2022-05-27 LAB — GLUCOSE, CAPILLARY: Glucose-Capillary: 130 mg/dL — ABNORMAL HIGH (ref 70–99)

## 2022-05-27 LAB — BASIC METABOLIC PANEL
Anion gap: 6 (ref 5–15)
BUN: 16 mg/dL (ref 6–20)
CO2: 24 mmol/L (ref 22–32)
Calcium: 8.7 mg/dL — ABNORMAL LOW (ref 8.9–10.3)
Chloride: 105 mmol/L (ref 98–111)
Creatinine, Ser: 1.29 mg/dL — ABNORMAL HIGH (ref 0.61–1.24)
GFR, Estimated: >60 mL/min (ref >60)
Glucose, Bld: 91 mg/dL (ref 70–99)
Potassium: 3.9 mmol/L (ref 3.5–5.1)
Sodium: 135 mmol/L (ref 135–145)

## 2022-05-27 MED ORDER — MIRTAZAPINE 15 MG PO TABS: 15.0000 mg | ORAL_TABLET | Freq: Every day | ORAL | 0 refills | Status: AC

## 2022-05-27 NOTE — Progress Notes (Signed)
LTM EEG maint complete.  Minor skin breakdown on FP2,  moved over 1 cm.   Leads FP1, FP2, FZ, CZ, and EKG 1 and 2 all under 10.

## 2022-05-27 NOTE — Progress Notes (Signed)
LTM disconnected.  Minor skin breakdown on FP2.  [facility] notified

## 2022-05-27 NOTE — TOC Transition Note (Signed)
Transition of Care [facility]) - CM/SW Discharge Note   Patient Details  Name: John Flynn MRN: [id number] Date of Birth: 03-15-1986  Transition of Care [facility]) CM/SW Contact:  Kermit Balo, RN Phone Number: 05/27/2022, 12:22 PM   Clinical Narrative:    [facility] is from jail and is returning today. CM spoke to nursing at the medical part of the jail and they verified the medications he is on at the jail. She stated that if new medications are sent with a prescription that they would be run by the MD at the jail and ordered. Pt would be able to receive tonight. CM has provided the guard at the bedside the prescription for Remeron.  Guards will provide needed transportation back to the facility today.    Final next level of care: Other (comment) Barriers to Discharge: No Barriers Identified   Patient Goals and CMS Choice        Discharge Placement                       Discharge Plan and Services                                     Social Determinants of Health (SDOH) Interventions     Readmission Risk Interventions     No data to display

## 2022-05-27 NOTE — Discharge Summary (Signed)
Name: John Flynn MRN: [id number] DOB: 1986-02-16 36 y.o. PCP: Patient, No Pcp Per  Date of Admission: 05/25/2022  2:33 PM Date of Discharge: No discharge date for patient encounter. Attending Physician: Mercie Eon, MD  Discharge Diagnosis: Principal Problem:   Seizure [facility]) Active Problems:   AKI (acute kidney injury) (HCC)   History of DVT (deep vein thrombosis)   Seizure disorder (HCC)   Intermittent explosive disorder   Posttraumatic stress disorder   Discharge Medications: Allergies as of 05/27/2022       Reactions   Eggs Or Egg-derived Products Anaphylaxis   Shock Syndrome   Influenza Virus Vaccine Anaphylaxis   Aspirin Other (See Comments)   Toxic Shock Syndrome   Nsaids Other (See Comments)   Toxic Shock Syndrome        Medication List     TAKE these medications    albuterol 108 (90 Base) MCG/ACT inhaler Commonly known as: VENTOLIN HFA Inhale 2 puffs into the lungs 2 (two) times daily as needed for wheezing or shortness of breath.   alum & mag hydroxide-simeth 200-200-20 MG/5ML suspension Commonly known as: MAALOX/MYLANTA Take 30 mLs by mouth 3 (three) times daily.   cetirizine 10 MG tablet Commonly known as: ZYRTEC Take 10 mg by mouth at bedtime.   ciclesonide 80 MCG/ACT inhaler Commonly known as: ALVESCO Inhale 1 puff into the lungs 2 (two) times daily.   lacosamide 200 MG Tabs tablet Commonly known as: VIMPAT Take 200 mg by mouth 2 (two) times daily.   levETIRAcetam 750 MG tablet Commonly known as: KEPPRA Take 750 mg by mouth daily.   mirtazapine 15 MG tablet Commonly known as: REMERON Take 1 tablet (15 mg total) by mouth at bedtime.   rivaroxaban 20 MG Tabs tablet Commonly known as: XARELTO Take 20 mg by mouth daily with supper.   sodium chloride 0.65 % nasal spray Commonly known as: OCEAN Place 2 sprays into the nose 2 (two) times daily.        Disposition and follow-up:   Mr.Clebert Arbaugh was discharged from  [facility] in Good condition.  At the hospital follow up visit please address:  *If patient has further seizure-like activity, he will not need to return to the Emergency Department unless he does not return to his baseline within an hour.*  1.   Seizures -Assess if has had any further seizures -Management of AED -Assess for any electrolyte derrangement  Elevated Cr -BMP to ensure no AKI  Anxiety PTSD -Assess if mirtazapine dosing appropriate  2.  Labs / imaging needed at time of follow-up: BMP  3.  Pending labs/ test needing follow-up: none  Follow-up Appointments: None  Hospital Course by problem list: Seizure disorder Patient presented after multiple seizure-like episodes.  Patient was initially loaded with Keppra.  However, patient did not have any postictal state after these episodes so it was suspected that the seizures were nonepileptic and psychogenic in nature.  Patient also had LTM EEG while hospitalized and while patient did have multiple seizure-like episodes, none of them had associated positive EEG findings.  Given patient did not have a postictal state after any of the seizures either, very low suspicion for seizures.  Neurology was consulted and they recommended continuing his antiepileptic medication regimen outpatient.. Recommended to wean from keppra per outpatient physician.  Elevated Cr Patient had mild elevation in creatinine up to 1.37.  Suspected that this was prerenal due to poor p.o. intake.  Appeared to improve with some IV  fluid hydration and encouraged p.o. intake.  Anxiety PTSD Psychiatry was consulted due to patient's anxiety regarding his 20+ year jail time he still has to serve as well as reported history of PTSD.  Patient was started on Remeron 15 mg for anxiety, insomnia, PTSD.   Other chronic conditions were medically managed with home medications and formulary alternatives as necessary (history of PE/DVT)  Discharge Exam:    BP (!) 101/50 (BP Location: Right Arm)   Pulse 72   Temp 97.6 F (36.4 C) (Oral)   Resp 16   Ht 5\' 9"  (1.753 m)   Wt 80 kg   SpO2 97%   BMI 26.05 kg/m  Discharge exam:  Constitutional: well-appearing male sitting in bed, in no acute distress HENT: normocephalic atraumatic, multiple tattoos noted throughout head Eyes: conjunctiva non-erythematous Neck: supple Cardiovascular: regular rate and rhythm, no m/r/g Pulmonary/Chest: normal work of breathing on room air Abdominal: non-distended MSK: normal bulk and tone Neurological: alert alert, conversant. Cranial nerves grossly intact.  Moves all extremities. No sensory deficits noted. Skin: warm and dry  Pertinent Labs, Studies, and Procedures:  MR BRAIN W WO CONTRAST  Result Date: 05/26/2022 CLINICAL DATA:  Seizure EXAM: MRI HEAD WITHOUT AND WITH CONTRAST TECHNIQUE: Multiplanar, multiecho pulse sequences of the brain and surrounding structures were obtained without and with intravenous contrast. CONTRAST:  8mL GADAVIST GADOBUTROL 1 MMOL/ML IV SOLN COMPARISON:  None Available. FINDINGS: Brain: No acute infarct, mass effect or extra-axial collection. No acute or chronic hemorrhage. Normal white matter signal, parenchymal volume and CSF spaces. The midline structures are normal. The hippocampi are normal and symmetric in size and signal. The hypothalamus and mamillary bodies are normal. There is no cortical ectopia or dysplasia. No abnormal contrast enhancement. Vascular: Major flow voids are preserved. Skull and upper cervical spine: Normal calvarium and skull base. Visualized upper cervical spine and soft tissues are normal. Sinuses/Orbits:No paranasal sinus fluid levels or advanced mucosal thickening. No mastoid or middle ear effusion. Normal orbits. IMPRESSION: Normal MRI of the brain. Electronically Signed   By: Deatra Robinson M.D.   On: 05/26/2022 20:33   Overnight EEG with video  Result Date: 05/26/2022 Charlsie Quest, MD      05/27/2022  9:22 AM Patient Name: John Flynn MRN: [id number] Epilepsy Attending: Charlsie Quest Referring Physician/Provider: Jefferson Fuel, MD Duration: 05/25/2022 2128 to 05/26/2022 2128  Patient history: 36 year old patient with history of seizures had a seizure event at the jail today which is consistent with his typical seizures. EEG for spell characterization.  Level of alertness: Awake, asleep  AEDs during EEG study: LEV, LCM  Technical aspects: This EEG study was done with scalp electrodes positioned according to the 10-20 International system of electrode placement. Electrical activity was acquired at a sampling rate of 500Hz  and reviewed with a high frequency filter of 70Hz  and a low frequency filter of 1Hz . EEG data were recorded continuously and digitally stored.  Description: The posterior dominant rhythm consists of 9 Hz activity of moderate voltage (25-35 uV) seen predominantly in posterior head regions, symmetric and reactive to eye opening and eye closing. Sleep was characterized by vertex waves, sleep spindles (12 to 14 Hz), maximal frontocentral region.  There is an excessive amount of 15 to 18 Hz beta activity distributed symmetrically and diffusely. Physiologic photic driving was not seen during photic stimulation. Hyperventilation was not performed.    ABNORMALITY - Excessive beta, generalized  IMPRESSION: This study is within normal limits. The excessive beta  activity seen in the background is most likely due to the effect of benzodiazepine and is a benign EEG pattern. No seizures or epileptiform discharges were seen throughout the recording.   Charlsie Quest   EEG adult  Result Date: 05/26/2022 Charlsie Quest, MD     05/26/2022 11:56 AM Patient Name: Taio Buschman MRN: [id number] Epilepsy Attending: Charlsie Quest Referring Physician/Provider: Jefferson Fuel, MD Date: 05/25/2022 Duration: 29.32 mins Patient history: 36 year old patient with history of seizures had a  seizure event at the jail today which is consistent with his typical seizures. EEG for spell characterization. Level of alertness: Awake AEDs during EEG study: LEV, LCM Technical aspects: This EEG study was done with scalp electrodes positioned according to the 10-20 International system of electrode placement. Electrical activity was acquired at a sampling rate of 500Hz  and reviewed with a high frequency filter of 70Hz  and a low frequency filter of 1Hz . EEG data were recorded continuously and digitally stored. Description: The posterior dominant rhythm consists of 9 Hz activity of moderate voltage (25-35 uV) seen predominantly in posterior head regions, symmetric and reactive to eye opening and eye closing. Sleep was characterized by vertex waves, sleep spindles (12 to 14 Hz), maximal frontocentral region.  There is an excessive amount of 15 to 18 Hz beta activity distributed symmetrically and diffusely. Physiologic photic driving was not seen during photic stimulation. Hyperventilation was not performed.   At 2113, during 3hz  photic, patient was noted to have head and shaking and eye flutter. He was able to follow commands. Concomitant eeg before, during and after the event didn't show any eeg change to suggest seizure. ABNORMALITY - Excessive beta, generalized IMPRESSION: This study is within normal limits. The excessive beta activity seen in the background is most likely due to the effect of benzodiazepine and is a benign EEG pattern. No seizures or epileptiform discharges were seen throughout the recording. One event was recorded at 2113 as described above without concomitant eeg change. This was not an epileptic event. Charlsie Quest   DG Pelvis 1-2 Views  Result Date: 05/26/2022 CLINICAL DATA:  Evaluate for retained metallic fragments, pre MRI EXAM: PELVIS - 2 VIEW COMPARISON:  None Available. FINDINGS: Pelvic ring is intact. Postsurgical changes in the proximal right femur are noted. No radiopaque  foreign body is seen. No soft tissue abnormality is noted. IMPRESSION: No evidence of radiopaque foreign body. Electronically Signed   By: Alcide Clever M.D.   On: 05/26/2022 00:22   DG Abd 1 View  Result Date: 05/26/2022 CLINICAL DATA:  Possible retained metallic fragment, initial encounter EXAM: ABDOMEN - 1 VIEW COMPARISON:  None Available. FINDINGS: Postsurgical changes are noted in the proximal right femur. No bony abnormality is seen. Scattered large and small bowel gas is noted. No radiopaque foreign body is seen. IMPRESSION: No acute abnormality noted.  No radiopaque foreign body is noted. Electronically Signed   By: Alcide Clever M.D.   On: 05/26/2022 00:21   CT HEAD WO CONTRAST ( )  Result Date: 05/25/2022 CLINICAL DATA:  Head trauma, multiple seizures hit head on metal table EXAM: CT HEAD WITHOUT CONTRAST CT CERVICAL SPINE WITHOUT CONTRAST TECHNIQUE: Multidetector CT imaging of the head and cervical spine was performed following the standard protocol without intravenous contrast. Multiplanar CT image reconstructions of the cervical spine were also generated. RADIATION DOSE REDUCTION: This exam was performed according to the departmental dose-optimization program which includes automated exposure control, adjustment of the mA and/or kV according  to patient size and/or use of iterative reconstruction technique. COMPARISON:  05/21/2022 FINDINGS: CT HEAD FINDINGS Brain: No evidence of acute infarction, hemorrhage, hydrocephalus, extra-axial collection or mass lesion/mass effect. Vascular: No hyperdense vessel or unexpected calcification. Skull: Normal. Negative for fracture or focal lesion. Sinuses/Orbits: No acute finding. Other: None. CT CERVICAL SPINE FINDINGS Alignment: Normal. Skull base and vertebrae: No acute fracture. No primary bone lesion or focal pathologic process. Soft tissues and spinal canal: No prevertebral fluid or swelling. No visible canal hematoma. Disc levels: Moderate multilevel  disc space height loss and osteophytosis, worst at C6-C7. Upper chest: Negative. Other: None. IMPRESSION: 1. No acute intracranial pathology. 2. No fracture or static subluxation of the cervical spine. 3. Moderate multilevel cervical disc degenerative disease. Electronically Signed   By: Jearld Lesch M.D.   On: 05/25/2022 16:14   CT Cervical Spine Wo Contrast  Result Date: 05/25/2022 CLINICAL DATA:  Head trauma, multiple seizures hit head on metal table EXAM: CT HEAD WITHOUT CONTRAST CT CERVICAL SPINE WITHOUT CONTRAST TECHNIQUE: Multidetector CT imaging of the head and cervical spine was performed following the standard protocol without intravenous contrast. Multiplanar CT image reconstructions of the cervical spine were also generated. RADIATION DOSE REDUCTION: This exam was performed according to the departmental dose-optimization program which includes automated exposure control, adjustment of the mA and/or kV according to patient size and/or use of iterative reconstruction technique. COMPARISON:  05/21/2022 FINDINGS: CT HEAD FINDINGS Brain: No evidence of acute infarction, hemorrhage, hydrocephalus, extra-axial collection or mass lesion/mass effect. Vascular: No hyperdense vessel or unexpected calcification. Skull: Normal. Negative for fracture or focal lesion. Sinuses/Orbits: No acute finding. Other: None. CT CERVICAL SPINE FINDINGS Alignment: Normal. Skull base and vertebrae: No acute fracture. No primary bone lesion or focal pathologic process. Soft tissues and spinal canal: No prevertebral fluid or swelling. No visible canal hematoma. Disc levels: Moderate multilevel disc space height loss and osteophytosis, worst at C6-C7. Upper chest: Negative. Other: None. IMPRESSION: 1. No acute intracranial pathology. 2. No fracture or static subluxation of the cervical spine. 3. Moderate multilevel cervical disc degenerative disease. Electronically Signed   By: Jearld Lesch M.D.   On: 05/25/2022 16:14   CT Head  Wo Contrast  Result Date: 05/21/2022 CLINICAL DATA:  Seizure EXAM: CT HEAD WITHOUT CONTRAST TECHNIQUE: Contiguous axial images were obtained from the base of the skull through the vertex without intravenous contrast. RADIATION DOSE REDUCTION: This exam was performed according to the departmental dose-optimization program which includes automated exposure control, adjustment of the mA and/or kV according to patient size and/or use of iterative reconstruction technique. COMPARISON:  CT brain 01/16/2022 FINDINGS: Brain: No acute territorial infarction, hemorrhage or intracranial mass. The ventricles are nonenlarged. Vascular: No hyperdense vessel or unexpected calcification. Skull: Normal. Negative for fracture or focal lesion. Sinuses/Orbits: No acute finding. Other: None IMPRESSION: Negative non contrasted CT appearance of the brain Electronically Signed   By: Jasmine Pang M.D.   On: 05/21/2022 22:41   DG Knee Complete 4 Views Right  Result Date: 05/21/2022 CLINICAL DATA:  Encounter for pain, pt states he feels like his upper leg and crunchy EXAM: RIGHT KNEE - COMPLETE 4+ VIEW COMPARISON:  None Available. FINDINGS: Partially visualized intramedullary distal femur nail fixation. No evidence of fracture, dislocation, or joint effusion. No evidence of arthropathy or other focal bone abnormality. Soft tissues are unremarkable. IMPRESSION: Negative. Electronically Signed   By: Tish Frederickson M.D.   On: 05/21/2022 21:46       Latest Ref Rng &  Units 05/26/2022    3:47 AM 05/25/2022    3:21 PM 05/21/2022    9:10 PM  CBC  WBC 4.0 - 10.5 K/uL 8.5  6.4  6.5   Hemoglobin 13.0 - 17.0 g/dL 16.1  09.6  04.5   Hematocrit 39.0 - 52.0 % 49.8  52.4  50.1   Platelets 150 - 400 K/uL 241  247  265       Latest Ref Rng & Units 05/27/2022    1:04 AM 05/26/2022    3:47 AM 05/25/2022    3:21 PM  BMP  Glucose 70 - 99 mg/dL 91  409  811   BUN 6 - 20 mg/dL 16  11  10    Creatinine 0.61 - 1.24 mg/dL 9.14  7.82  9.56    Sodium 135 - 145 mmol/L 135  138  140   Potassium 3.5 - 5.1 mmol/L 3.9  3.6  4.0   Chloride 98 - 111 mmol/L 105  107  103   CO2 22 - 32 mmol/L 24  24  29    Calcium 8.9 - 10.3 mg/dL 8.7  9.1  9.4     Discharge Instructions: Discharge Instructions     Call MD for:  persistant dizziness or light-headedness   Complete by: As directed    Call MD for:  persistant nausea and vomiting   Complete by: As directed    Call MD for:  redness, tenderness, or signs of infection (pain, swelling, redness, odor or green/yellow discharge around incision site)   Complete by: As directed    Call MD for:  severe uncontrolled pain   Complete by: As directed    Call MD for:  temperature >100.4   Complete by: As directed    Diet - low sodium heart healthy   Complete by: As directed    Discharge instructions   Complete by: As directed    Dear Mr. Persico,  It was a pleasure taking care of you while you were in the hospital. You were admitted due to having multiple seizure-like episodes. During this hospitalization, you have not had any seizures on EEG. You will be discharged with the same anti-epileptic regiment. We have added remeron (mirtazapine) do your medication regiment for anxiety.   If you have any worsening symptoms, please contact your PCP.   Take care! -[facility]   Increase activity slowly   Complete by: As directed        Signed: Park Pope, MD 05/27/2022, 11:40 AM   Pager: [phone]

## 2022-07-21 ENCOUNTER — Emergency Department (HOSPITAL_COMMUNITY)

## 2022-07-21 ENCOUNTER — Inpatient Hospital Stay (HOSPITAL_COMMUNITY)
Admission: EM | Admit: 2022-07-21 | Discharge: 2022-07-24 | DRG: 101 | Attending: Family Medicine | Admitting: Family Medicine

## 2022-07-21 ENCOUNTER — Encounter (HOSPITAL_COMMUNITY): Payer: Self-pay | Admitting: Student

## 2022-07-21 DIAGNOSIS — G40909 Epilepsy, unspecified, not intractable, without status epilepticus: Secondary | ICD-10-CM | POA: Diagnosis not present

## 2022-07-21 DIAGNOSIS — Z86718 Personal history of other venous thrombosis and embolism: Secondary | ICD-10-CM

## 2022-07-21 DIAGNOSIS — Z8673 Personal history of transient ischemic attack (TIA), and cerebral infarction without residual deficits: Secondary | ICD-10-CM

## 2022-07-21 DIAGNOSIS — F419 Anxiety disorder, unspecified: Secondary | ICD-10-CM | POA: Diagnosis present

## 2022-07-21 DIAGNOSIS — F4310 Post-traumatic stress disorder, unspecified: Secondary | ICD-10-CM | POA: Diagnosis present

## 2022-07-21 DIAGNOSIS — Z888 Allergy status to other drugs, medicaments and biological substances status: Secondary | ICD-10-CM

## 2022-07-21 DIAGNOSIS — R079 Chest pain, unspecified: Secondary | ICD-10-CM

## 2022-07-21 DIAGNOSIS — J449 Chronic obstructive pulmonary disease, unspecified: Secondary | ICD-10-CM | POA: Diagnosis present

## 2022-07-21 DIAGNOSIS — F39 Unspecified mood [affective] disorder: Secondary | ICD-10-CM | POA: Diagnosis present

## 2022-07-21 DIAGNOSIS — F17200 Nicotine dependence, unspecified, uncomplicated: Secondary | ICD-10-CM | POA: Diagnosis present

## 2022-07-21 DIAGNOSIS — F4312 Post-traumatic stress disorder, chronic: Secondary | ICD-10-CM | POA: Diagnosis present

## 2022-07-21 DIAGNOSIS — G4700 Insomnia, unspecified: Secondary | ICD-10-CM | POA: Diagnosis present

## 2022-07-21 DIAGNOSIS — Z91012 Allergy to eggs: Secondary | ICD-10-CM

## 2022-07-21 DIAGNOSIS — I10 Essential (primary) hypertension: Secondary | ICD-10-CM | POA: Diagnosis present

## 2022-07-21 DIAGNOSIS — Z823 Family history of stroke: Secondary | ICD-10-CM

## 2022-07-21 DIAGNOSIS — R569 Unspecified convulsions: Secondary | ICD-10-CM | POA: Diagnosis not present

## 2022-07-21 DIAGNOSIS — Z7901 Long term (current) use of anticoagulants: Secondary | ICD-10-CM

## 2022-07-21 DIAGNOSIS — Z818 Family history of other mental and behavioral disorders: Secondary | ICD-10-CM

## 2022-07-21 DIAGNOSIS — Z79899 Other long term (current) drug therapy: Secondary | ICD-10-CM

## 2022-07-21 DIAGNOSIS — R072 Precordial pain: Secondary | ICD-10-CM | POA: Diagnosis present

## 2022-07-21 DIAGNOSIS — Z86711 Personal history of pulmonary embolism: Secondary | ICD-10-CM

## 2022-07-21 HISTORY — DX: Conversion disorder with seizures or convulsions: F44.5

## 2022-07-21 LAB — CBC WITH DIFFERENTIAL/PLATELET
Abs Immature Granulocytes: 0.05 10*3/uL (ref 0.00–0.07)
Basophils Absolute: 0 10*3/uL (ref 0.0–0.1)
Basophils Relative: 0 %
Eosinophils Absolute: 0 10*3/uL (ref 0.0–0.5)
Eosinophils Relative: 0 %
HCT: 47.4 % (ref 39.0–52.0)
Hemoglobin: 15.7 g/dL (ref 13.0–17.0)
Immature Granulocytes: 1 %
Lymphocytes Relative: 22 %
Lymphs Abs: 1.8 10*3/uL (ref 0.7–4.0)
MCH: 27.9 pg (ref 26.0–34.0)
MCHC: 33.1 g/dL (ref 30.0–36.0)
MCV: 84.3 fL (ref 80.0–100.0)
Monocytes Absolute: 0.6 10*3/uL (ref 0.1–1.0)
Monocytes Relative: 7 %
Neutro Abs: 5.8 10*3/uL (ref 1.7–7.7)
Neutrophils Relative %: 70 %
Platelets: 264 10*3/uL (ref 150–400)
RBC: 5.62 MIL/uL (ref 4.22–5.81)
RDW: 13.2 % (ref 11.5–15.5)
WBC: 8.3 10*3/uL (ref 4.0–10.5)
nRBC: 0 % (ref 0.0–0.2)

## 2022-07-21 LAB — CBG MONITORING, ED: Glucose-Capillary: 154 mg/dL — ABNORMAL HIGH (ref 70–99)

## 2022-07-21 MED ORDER — LACOSAMIDE 50 MG PO TABS
200.0000 mg | ORAL_TABLET | Freq: Two times a day (BID) | ORAL | Status: DC
  Administered 2022-07-21: 200 mg via ORAL
  Filled 2022-07-21: qty 4

## 2022-07-21 MED ORDER — LEVETIRACETAM 500 MG PO TABS
500.0000 mg | ORAL_TABLET | Freq: Once | ORAL | Status: AC
  Administered 2022-07-21: 500 mg via ORAL
  Filled 2022-07-21: qty 1

## 2022-07-21 NOTE — ED Provider Notes (Signed)
[facility] EMERGENCY [facility]PARTMENT Provider Note   CSN: [id number] Arrival date & time: 07/21/22  2154     History {Add pertinent medical, surgical, social history, OB history to HPI:1} Chief Complaint  Patient presents with   Seizures    John NeighboursChristopher Flynn is a 36 y.o. male with a hx of VTE on xarelto, seizures, prior stroke, and PTSD who presents to the ED for evaluation of multiple seizures throughout the weekend, has had 8 today in total. Patient states he has been having recurrent seizures, but is unsure of the details of how many or how long they last. Per jail staff to EMS seized 6 times in jail today, received 2 mg of Ativan IM prior to EMS arrival and subsequently seized twice with EMS who gave 5 mg of Versed with resolution. Per EMS generalized tonic clonic, not responding to stimuli during seizure activity. Patient states currently he has a headache. Per jail staff he did fall and hit his head with his seizures. He has had some chest & epigastric abdominal discomfort for the past week, no alleviating/aggravating factors, nauseous w/ 2 episodes of emesis in the last 2 days. Currently taking vimpat and is tapering off of Keppra as it was felt that this escalated his aggressive behavior. He denies fever, chills, URI sxs, dyspnea, diarrhea, melena, numbness, or weakness.   HPI     Home Medications Prior to Admission medications   Medication Sig Start Date End Date Taking? Authorizing Provider  albuterol (VENTOLIN HFA) 108 (90 Base) MCG/ACT inhaler Inhale 2 puffs into the lungs 2 (two) times daily as needed for wheezing or shortness of breath.    [provider]  alum & mag hydroxide-simeth (MAALOX/MYLANTA) 200-200-20 MG/5ML suspension Take 30 mLs by mouth 3 (three) times daily.    [provider]  cetirizine (ZYRTEC) 10 MG tablet Take 10 mg by mouth at bedtime.    [provider]  ciclesonide (ALVESCO) 80 MCG/ACT inhaler Inhale 1 puff into  the lungs 2 (two) times daily.    [provider]  lacosamide (VIMPAT) 200 MG TABS tablet Take 200 mg by mouth 2 (two) times daily. 12/15/21   [provider]  levETIRAcetam (KEPPRA) 750 MG tablet Take 750 mg by mouth daily.    [provider]  mirtazapine (REMERON) 15 MG tablet Take 1 tablet (15 mg total) by mouth at bedtime. 05/27/22 06/26/22  Park PopeJi, Andrew, MD  rivaroxaban (XARELTO) 20 MG TABS tablet Take 20 mg by mouth daily with supper.    [provider]  sodium chloride (OCEAN) 0.65 % nasal spray Place 2 sprays into the nose 2 (two) times daily.    [provider]  phenytoin (DILANTIN) 300 MG ER capsule Take 300 mg by mouth 2 (two) times daily.  11/21/20  [provider]      Allergies    Eggs or egg-derived products, Influenza virus vaccine, Aspirin, and Nsaids    Review of Systems   Review of Systems  Constitutional:  Negative for chills and fever.  HENT:  Negative for congestion.   Respiratory:  Negative for shortness of breath.   Cardiovascular:  Positive for chest pain.  Gastrointestinal:  Positive for abdominal pain, nausea and vomiting. Negative for blood in stool and diarrhea.  Musculoskeletal:  Positive for neck pain.  Neurological:  Positive for seizures and headaches. Negative for weakness and numbness.  All other systems reviewed and are negative.   Physical Exam Updated Vital Signs BP (!) 134/98 (BP  Location: Right Arm)   Pulse (!) 108   Temp 97.6 F (36.4 C) (Oral)   Resp 16   SpO2 94%  Physical Exam Vitals and nursing note reviewed.  Constitutional:      General: He is not in acute distress.    Appearance: He is well-developed. He is not toxic-appearing.  HENT:     Head: Normocephalic. No raccoon eyes or Battle's sign.      Nose:     Right Nostril: No septal hematoma.     Left Nostril: No septal hematoma.     Mouth/Throat:     Pharynx: Uvula midline.  Eyes:     General:        Right eye: No discharge.         Left eye: No discharge.     Extraocular Movements: Extraocular movements intact.     Conjunctiva/sclera: Conjunctivae normal.     Pupils: Pupils are equal, round, and reactive to light.  Neck:     Comments: C collar in place.  Cardiovascular:     Rate and Rhythm: Regular rhythm. Tachycardia present.     Pulses:          Radial pulses are 2+ on the right side and 2+ on the left side.       Dorsalis pedis pulses are 2+ on the right side and 2+ on the left side.  Pulmonary:     Effort: No respiratory distress.     Breath sounds: Normal breath sounds. No wheezing or rales.  Chest:     Chest wall: Tenderness present.  Abdominal:     General: There is no distension.     Palpations: Abdomen is soft.     Tenderness: There is abdominal tenderness in the epigastric area. There is no guarding or rebound. Negative signs include Murphy's sign.  Musculoskeletal:     Cervical back: Spinous process tenderness and muscular tenderness present.  Skin:    General: Skin is warm and dry.  Neurological:     Mental Status: He is alert.     Comments: Clear speech. PERRL. No facial droop. Sensation grossly intact x 4. 5/5 symmetric grip strength and strength with plantar/dorsiflexion bilaterally.   Psychiatric:        Behavior: Behavior normal.     ED Results / Procedures / Treatments   Labs (all labs ordered are listed, but only abnormal results are displayed) Labs Reviewed  COMPREHENSIVE METABOLIC PANEL  CBC WITH DIFFERENTIAL/PLATELET  LIPASE, BLOOD  CBG MONITORING, ED  TROPONIN I (HIGH SENSITIVITY)    EKG None  Radiology No results found.  Procedures Procedures  {Document cardiac monitor, telemetry assessment procedure when appropriate:1}  Medications Ordered in ED Medications - No data to display  ED Course/ Medical Decision Making/ A&P                           Medical Decision Making Amount and/or Complexity of Data Reviewed Labs: ordered. Radiology:  ordered.   Patient presents to the ED with complaints of ***, this involves an extensive number of treatment options, and is a complaint that carries with it a high risk of complications and morbidity. Nontoxic, vitals ***.   Ddx including but not limited to: ***  Additional history obtained:  Chart/nursing notes reviewed***.  External records viewed including: Admission to the hospital 05/24/22 for status epilepticus, neurology was consulted, patient underwent EEG for spell characterization, per neurology felt to have both epileptic seizures  and nonepileptic spells.  Recommendation was for continuing his and Vimpat and to continue tapering his Keppra, prior she was consulted during admission and started him on mirtazapine for sleep and anxiety.  EKG: ***  Lab Tests:  I viewed & interpreted labs including:  ***  Imaging Studies:  I ordered and viewed the following imaging, agree with radiologist impression:  ***  ED Course:  I ordered medications including *** for ***  ***: RE-EVAL: ***   22:52: CONSULT: Discussed with neurologist Dr.  Based on patient's chief complaint, I considered admission might be necessary, however after reassuring ED workup feel patient is reasonable for discharge.   Critical Interventions: ***  Social determinants: *** Social determinants of health: this may be used for patients who have homelessness, poor access to medical care, need assistance with transition of care for placement, medication or transportation or follow up. You may also use the section for patients who struggle with substance abuse, have dementia, nursing home patients, patients who are severely disabled or have some degree of mental retardation and are not able to fully participate in their care.   Portions of this note were generated with Scientist, clinical (histocompatibility and immunogenetics). Dictation errors may occur despite best attempts at proofreading.   {Document critical care time when  appropriate:1} {Document review of labs and clinical decision tools ie heart score, Chads2Vasc2 etc:1}  {Document your independent review of radiology images, and any outside records:1} {Document your discussion with family members, caretakers, and with consultants:1} {Document social determinants of health affecting pt's care:1} {Document your decision making why or why not admission, treatments were needed:1} Final Clinical Impression(s) / ED Diagnoses Final diagnoses:  None    Rx / DC Orders ED Discharge Orders     None

## 2022-07-21 NOTE — ED Triage Notes (Signed)
Pt BIB EMS from jail for seizures. Pt had 6 witnessed at the jail and 2 seizures witnessed by EMS. The jail administered 2mg  IM ativan and EMS administered 5mg  IV versed. Jail reports that he has been seizing all weekend and that they were tryibng to wean him off his Keppra. Hematoma above left eye c/o CP and HA   VSS   20g LAC

## 2022-07-22 ENCOUNTER — Emergency Department (HOSPITAL_COMMUNITY)

## 2022-07-22 ENCOUNTER — Encounter (HOSPITAL_COMMUNITY): Payer: Self-pay | Admitting: Neurology

## 2022-07-22 ENCOUNTER — Other Ambulatory Visit: Payer: Self-pay

## 2022-07-22 ENCOUNTER — Inpatient Hospital Stay (HOSPITAL_COMMUNITY)

## 2022-07-22 DIAGNOSIS — R569 Unspecified convulsions: Secondary | ICD-10-CM

## 2022-07-22 DIAGNOSIS — Z823 Family history of stroke: Secondary | ICD-10-CM | POA: Diagnosis not present

## 2022-07-22 DIAGNOSIS — Z8673 Personal history of transient ischemic attack (TIA), and cerebral infarction without residual deficits: Secondary | ICD-10-CM | POA: Diagnosis not present

## 2022-07-22 DIAGNOSIS — J449 Chronic obstructive pulmonary disease, unspecified: Secondary | ICD-10-CM | POA: Diagnosis present

## 2022-07-22 DIAGNOSIS — F4310 Post-traumatic stress disorder, unspecified: Secondary | ICD-10-CM | POA: Diagnosis not present

## 2022-07-22 DIAGNOSIS — Z91012 Allergy to eggs: Secondary | ICD-10-CM | POA: Diagnosis not present

## 2022-07-22 DIAGNOSIS — G40909 Epilepsy, unspecified, not intractable, without status epilepticus: Secondary | ICD-10-CM | POA: Diagnosis present

## 2022-07-22 DIAGNOSIS — F4312 Post-traumatic stress disorder, chronic: Secondary | ICD-10-CM | POA: Diagnosis present

## 2022-07-22 DIAGNOSIS — G4700 Insomnia, unspecified: Secondary | ICD-10-CM | POA: Diagnosis present

## 2022-07-22 DIAGNOSIS — Z86711 Personal history of pulmonary embolism: Secondary | ICD-10-CM | POA: Diagnosis not present

## 2022-07-22 DIAGNOSIS — Z7901 Long term (current) use of anticoagulants: Secondary | ICD-10-CM | POA: Diagnosis not present

## 2022-07-22 DIAGNOSIS — Z79899 Other long term (current) drug therapy: Secondary | ICD-10-CM | POA: Diagnosis not present

## 2022-07-22 DIAGNOSIS — I10 Essential (primary) hypertension: Secondary | ICD-10-CM | POA: Diagnosis present

## 2022-07-22 DIAGNOSIS — Z888 Allergy status to other drugs, medicaments and biological substances status: Secondary | ICD-10-CM | POA: Diagnosis not present

## 2022-07-22 DIAGNOSIS — F419 Anxiety disorder, unspecified: Secondary | ICD-10-CM | POA: Diagnosis present

## 2022-07-22 DIAGNOSIS — R072 Precordial pain: Secondary | ICD-10-CM | POA: Diagnosis present

## 2022-07-22 DIAGNOSIS — Z86718 Personal history of other venous thrombosis and embolism: Secondary | ICD-10-CM | POA: Diagnosis not present

## 2022-07-22 DIAGNOSIS — F39 Unspecified mood [affective] disorder: Secondary | ICD-10-CM | POA: Diagnosis present

## 2022-07-22 DIAGNOSIS — Z818 Family history of other mental and behavioral disorders: Secondary | ICD-10-CM | POA: Diagnosis not present

## 2022-07-22 DIAGNOSIS — F17200 Nicotine dependence, unspecified, uncomplicated: Secondary | ICD-10-CM | POA: Diagnosis present

## 2022-07-22 LAB — TROPONIN I (HIGH SENSITIVITY)
Troponin I (High Sensitivity): 22 ng/L — ABNORMAL HIGH (ref <18)
Troponin I (High Sensitivity): 25 ng/L — ABNORMAL HIGH (ref <18)

## 2022-07-22 LAB — URINALYSIS, ROUTINE W REFLEX MICROSCOPIC
Bilirubin Urine: NEGATIVE
Glucose, UA: NEGATIVE mg/dL
Hgb urine dipstick: NEGATIVE
Ketones, ur: NEGATIVE mg/dL
Leukocytes,Ua: NEGATIVE
Nitrite: NEGATIVE
Protein, ur: NEGATIVE mg/dL
Specific Gravity, Urine: 1.015 (ref 1.005–1.030)
pH: 7 (ref 5.0–8.0)

## 2022-07-22 LAB — COMPREHENSIVE METABOLIC PANEL
ALT: 28 U/L (ref 0–44)
AST: 25 U/L (ref 15–41)
Albumin: 3.6 g/dL (ref 3.5–5.0)
Alkaline Phosphatase: 64 U/L (ref 38–126)
Anion gap: 7 (ref 5–15)
BUN: 10 mg/dL (ref 6–20)
CO2: 24 mmol/L (ref 22–32)
Calcium: 9.1 mg/dL (ref 8.9–10.3)
Chloride: 108 mmol/L (ref 98–111)
Creatinine, Ser: 1.19 mg/dL (ref 0.61–1.24)
GFR, Estimated: >60 mL/min (ref >60)
Glucose, Bld: 107 mg/dL — ABNORMAL HIGH (ref 70–99)
Potassium: 4 mmol/L (ref 3.5–5.1)
Sodium: 139 mmol/L (ref 135–145)
Total Bilirubin: 0.6 mg/dL (ref 0.3–1.2)
Total Protein: 6.6 g/dL (ref 6.5–8.1)

## 2022-07-22 LAB — MAGNESIUM: Magnesium: 2.3 mg/dL (ref 1.7–2.4)

## 2022-07-22 LAB — RAPID URINE DRUG SCREEN, HOSP PERFORMED
Amphetamines: NOT DETECTED
Barbiturates: NOT DETECTED
Benzodiazepines: POSITIVE — AB
Cocaine: NOT DETECTED
Opiates: NOT DETECTED
Tetrahydrocannabinol: NOT DETECTED

## 2022-07-22 LAB — LIPASE, BLOOD: Lipase: 26 U/L (ref 11–51)

## 2022-07-22 MED ORDER — CICLESONIDE 80 MCG/ACT IN AERS
1.0000 | INHALATION_SPRAY | Freq: Two times a day (BID) | RESPIRATORY_TRACT | Status: DC
Start: 2022-07-22 — End: 2022-07-22

## 2022-07-22 MED ORDER — LORAZEPAM 2 MG/ML IJ SOLN
INTRAMUSCULAR | Status: AC
  Filled 2022-07-22: qty 1

## 2022-07-22 MED ORDER — RIVAROXABAN 20 MG PO TABS
20.0000 mg | ORAL_TABLET | Freq: Every day | ORAL | Status: DC
  Administered 2022-07-22 – 2022-07-23 (×2): 20 mg via ORAL
  Filled 2022-07-22: qty 2
  Filled 2022-07-22: qty 1

## 2022-07-22 MED ORDER — BUDESONIDE 0.25 MG/2ML IN SUSP
0.2500 mg | Freq: Two times a day (BID) | RESPIRATORY_TRACT | Status: DC
  Administered 2022-07-22 – 2022-07-24 (×4): 0.25 mg via RESPIRATORY_TRACT
  Filled 2022-07-22 (×4): qty 2

## 2022-07-22 MED ORDER — ALBUTEROL SULFATE (2.5 MG/3ML) 0.083% IN NEBU: 2.5000 mg | INHALATION_SOLUTION | Freq: Two times a day (BID) | RESPIRATORY_TRACT | Status: DC | PRN

## 2022-07-22 MED ORDER — MIRTAZAPINE 15 MG PO TABS
15.0000 mg | ORAL_TABLET | Freq: Every day | ORAL | Status: DC
  Administered 2022-07-22 – 2022-07-23 (×2): 15 mg via ORAL
  Filled 2022-07-22 (×2): qty 1

## 2022-07-22 MED ORDER — MUPIROCIN 2 % EX OINT
TOPICAL_OINTMENT | Freq: Two times a day (BID) | CUTANEOUS | Status: DC
  Filled 2022-07-22: qty 22

## 2022-07-22 NOTE — Assessment & Plan Note (Addendum)
Uncontrolled PTSD and unable to sleep.  Consulted psych for recommendations. - Psych consult, appreciate recs - continue mirtazepine (Remeron) 15 mg
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
## 2022-07-22 NOTE — ED Notes (Signed)
Pt requesting pain meds, MD notified

## 2022-07-22 NOTE — Assessment & Plan Note (Addendum)
Patient received EEG overnight.  - Pending neurology read - neurology following, appreciate recs - hold lacosamide, keppra - seizure precautions

## 2022-07-22 NOTE — Hospital Course (Addendum)
John Flynn is a 36 year old male who was admitted for multiple seizures-like this is in the setting of psychological stressors and poor sleep, highly suspicious for psychogenic nonepileptic seizures.  Past medical history includes seizure disorder, PTSD, psychogenic nonepileptic seizures, DVT.  Seizure-like activity (HCC) 8/21: Patient presented to the ED after 8 witnessed seizure-like episodes.  He also h hit his head, and is on blood thinners.  CT was negative for bleeding or ischemia.  Neurology was consulted.  Patient was admitted to hospital for 24-hour EEG monitoring, seizure versus PNES rule out.  We discontinued Keppra and Vimpat per neurology's recommendations, to better assess seizure-like episodes.  EEG with video monitoring was negative-and he was monitored for an additional 24 hours.  Per neurology we will restart Keppra and Vimpat.  Stable at time of discharge. No recurrent episodes while hospitalized.  Posttraumatic stress disorder He was poorly controlled on mirtazapine.  Repeatedly having dreams that would wake him up and elicit chest pain.  Psychology was consulted and will use prazosin at night for better control.  There will be a prazosin titration.  Continued mirtazapine.  Patient agreed to this plan.  Suspicion for mood disorder Psychiatry concerned the patient may have a bipolar disorder. Per psych we will not start mood stabilizer, as they feel risk of starting mood stabilizer outweigh the benefits at this time-considering many mood stabilizers are incompatible with Xarelto and antipsychotic medication, would lower seizure threshold.  If mood stabilizers to be started outpatient Lamictal was the preferred agent-and Lamictal would replace Keppra on his medication list.  History of DVT History of DVT and pulmonary embolism requiring treatment.  He is on Xarelto and we continued his medication during his admission.  COPD (chronic obstructive pulmonary disease) (HCC) COPD  stable, uses inhalers at home.  He received nebulized budesonide and albuterol as needed.  Continue home inhalers at discharge.  PCP follow-up recommendations 1.)  Continue vimpat for seizure prophylaxis.  Please ensure patient has adequate refills of Vimpat-running out of this medication could lead to more seizure-like activity and require further hospitalizations. 2.) Prazosin titration for PTSD. Do not exceed 3mg  until evaluated for side-effects. If tolerating 3 mg continue slow titration until PTSD controlled or side-effect intolerable.  Continue Remeron. 3.)  Work-up concern for possible mood disorder.
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
## 2022-07-22 NOTE — Assessment & Plan Note (Deleted)
Hx psychogenic non-epileptic seizures, last presentation in June 2023. Today presented with 6 witnessed seizure-like activity in jail by staff, 2 by EMS, one in ED. Per previous evaluation by neurology while on EEG, no epileptiform activity was noted. Neurology following this admission with plan to observe further with EEG to capture potential seizure activity.

## 2022-07-22 NOTE — Care Plan (Signed)
LTM reviewed till 1630. No seizures.   John Flynn O John Flynn  

## 2022-07-22 NOTE — ED Notes (Signed)
This RN witnessed tonic clonic seizure upon pts arrival back to room from CT. Bebe Shaggy MD and Madilyn Hook MD called to bedside, Pecola Leisure MD advised this RN to hold ativan

## 2022-07-22 NOTE — Progress Notes (Addendum)
Video EEG complete - results pending.

## 2022-07-22 NOTE — Assessment & Plan Note (Addendum)
Unclear when exactly this was diagnosed and whether it was provoked/unprovoked. Has been on Xarelto since at least 2016 per chart review. 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 -Continue home Xarelto 20mg  daily

## 2022-07-22 NOTE — Assessment & Plan Note (Addendum)
Chronic, stable. -Hold home inhalers - Budesonide/albuterol nebulizers as needed
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
## 2022-07-23 DIAGNOSIS — R569 Unspecified convulsions: Secondary | ICD-10-CM | POA: Diagnosis not present

## 2022-07-23 DIAGNOSIS — R079 Chest pain, unspecified: Secondary | ICD-10-CM

## 2022-07-23 DIAGNOSIS — Z86718 Personal history of other venous thrombosis and embolism: Secondary | ICD-10-CM

## 2022-07-23 DIAGNOSIS — F4310 Post-traumatic stress disorder, unspecified: Secondary | ICD-10-CM

## 2022-07-23 DIAGNOSIS — Z7901 Long term (current) use of anticoagulants: Secondary | ICD-10-CM

## 2022-07-23 DIAGNOSIS — J449 Chronic obstructive pulmonary disease, unspecified: Secondary | ICD-10-CM | POA: Diagnosis not present

## 2022-07-23 LAB — TROPONIN I (HIGH SENSITIVITY)
Troponin I (High Sensitivity): 5 ng/L (ref <18)
Troponin I (High Sensitivity): 5 ng/L (ref <18)

## 2022-07-23 NOTE — Progress Notes (Addendum)
`    Daily Progress Note Intern Pager: [phone]  Patient name: John Flynn Medical record number: [id number] Date of birth: [birth year] Age: 36 y.o. Gender: male  Primary Care Provider: Patient, No Pcp Per Consultants: Neuro, psych Code Status: Full  Pt Overview and Major Events to Date:  8/21: Admitted to med telemetry 8/22: EEG negative findings  Assessment and Plan:  John Flynn is a 36 year old male presenting with multiple seizure-like episodes in the setting of psychological stressors and poor sleep, high suspicious for psychogenic nonepileptic seizures.  Differential for this patient includes epilepsy.  Follow-up seizure versus PNES work-up.  No evidence of stroke-negative CT findings and absence of focal neurological deficits.  * Seizure-like activity (HCC) Patient received EEG overnight.  Pending neurology read.  Patient reports no seizures overnight. - neurology following, appreciate recs - hold lacosamide, keppra - seizure precautions - UDS positive only for benzodiazepines received for seizure  Posttraumatic stress disorder Uncontrolled PTSD and unable to sleep.  Consulted psych for recommendations. - Psych consult, appreciate recs - continue mirtazepine (Remeron) 15 mg  History of DVT (deep vein thrombosis) Unclear when exactly this was diagnosed and whether it was provoked/unprovoked. Has been on Xarelto since at least 2016 per chart review. 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 -Continue home Xarelto 20mg  daily  COPD (chronic obstructive pulmonary disease) (HCC) Chronic, stable. -Hold home inhalers - Budesonide/albuterol nebulizers as needed  Acute chest pain Patient reports new onset substernal chest pain, crushing in nature.  Difficulty breathing.  Unknown length of time, however sometime this morning.  Not reproducible on exam.  Chest pain is likely secondary to  patient not receiving his nebulizer treatments for COPD, as he is wheezing and coughing. Rule out ACS.  - EKG - Troponin - Nebulizer treatment    FEN/GI: Regular diet PPx: Xarelto Dispo:Home pending clinical improvement .   Subjective:  Patient reports no seizures overnight.  However this morning he is wheezing and having difficulty breathing.  He also endorses substernal chest pain which she describes as crushing.  He has not received his nebulizer treatment in some time.   Objective: Temp:  [97.7 F (36.5 C)-98.6 F (37 C)] 98.2 F (36.8 C) (08/22 0745) Pulse Rate:  [62-86] 70 (08/22 0745) Resp:  [12-20] 18 (08/22 0745) BP: (112-139)/(84-107) 124/107 (08/22 0745) SpO2:  [95 %-100 %] 98 % (08/22 0745) Weight:  [97.1 kg] 97.1 kg (08/21 2136) Physical Exam: Cardiovascular: Regular rate and rhythm, no MRG.  Chest pain not reproducible on exam. Respiratory: Wheezing, increased work of breathing.  Coughing.   Laboratory: Most recent CBC Lab Results  Component Value Date   WBC 8.3 07/21/2022   HGB 15.7 07/21/2022   HCT 47.4 07/21/2022   MCV 84.3 07/21/2022   PLT 264 07/21/2022   Most recent BMP    Latest Ref Rng & Units 07/21/2022   11:32 PM  BMP  Glucose 70 - 99 mg/dL 07/23/2022   BUN 6 - 20 mg/dL 10   Creatinine 160 - 1.24 mg/dL 1.09   Sodium 3.23 - 557 mmol/L 139   Potassium 3.5 - 5.1 mmol/L 4.0   Chloride 98 - 111 mmol/L 108   CO2 22 - 32 mmol/L 24   Calcium 8.9 - 10.3 mg/dL 9.1     322, MD 07/23/2022, 7:52 AM  PGY-1, [facility] FPTS Intern pager: [phone], text pages welcome Secure chat group [facility] Teaching Service

## 2022-07-23 NOTE — Assessment & Plan Note (Addendum)
Resolved

## 2022-07-23 NOTE — Procedures (Signed)
Patient Name: John Flynn  MRN: [id number]  Epilepsy Attending: Charlsie Quest  Referring Physician/Provider: Gordy Councilman, MD  Duration: 07/22/2022 1344 to 07/23/2022 1344  Patient history: 36 year old male undergoing EEG for characterization of seizure-like episodes  Level of alertness: Awake, asleep  AEDs during EEG study: None  Technical aspects: This EEG study was done with scalp electrodes positioned according to the 10-20 International system of electrode placement. Electrical activity was reviewed with band pass filter of 1-70Hz , sensitivity of 7 uV/mm, display speed of 39mm/sec with a 60Hz  notched filter applied as appropriate. EEG data were recorded continuously and digitally stored.  Video monitoring was available and reviewed as appropriate.  Description: The posterior dominant rhythm consists of 9 Hz activity of moderate voltage (25-35 uV) seen predominantly in posterior head regions, symmetric and reactive to eye opening and eye closing. Sleep was characterized by vertex waves, sleep spindles (12 to 14 Hz), maximal frontocentral region.  Hyperventilation and photic stimulation were not performed.     IMPRESSION: This study is within normal limits. No seizures or epileptiform discharges were seen throughout the recording.  A normal interictal EEG does not exclude nor support the diagnosis of epilepsy.   Geanette Buonocore 
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
## 2022-07-23 NOTE — Progress Notes (Signed)
Subjective: No events overnight.  Patient denies any concerns  ROS: negative except above  Examination  Vital signs in last 24 hours: Temp:  [97.7 F (36.5 C)-98.6 F (37 C)] 98.2 F (36.8 C) (08/22 0745) Pulse Rate:  [62-86] 70 (08/22 0745) Resp:  [12-20] 18 (08/22 0745) BP: (112-139)/(84-107) 124/107 (08/22 0745) SpO2:  [95 %-100 %] 95 % (08/22 0825) Weight:  [97.1 kg] 97.1 kg (08/21 2136)  General: lying in bed, NAD CVS: pulse-normal rate and rhythm RS: breathing comfortably Extremities: normal   Neuro: MS: Alert, oriented, follows commands CN: pupils equal and reactive,  EOMI, face symmetric, tongue midline, normal sensation over face, Motor: 5/5 strength in all 4 extremities   Basic Metabolic Panel: Recent Labs  Lab 07/21/22 2332  NA 139  K 4.0  CL 108  CO2 24  GLUCOSE 107*  BUN 10  CREATININE 1.19  CALCIUM 9.1  MG 2.3    CBC: Recent Labs  Lab 07/21/22 2332  WBC 8.3  NEUTROABS 5.8  HGB 15.7  HCT 47.4  MCV 84.3  PLT 264     Coagulation Studies: No results for input(s): "LABPROT", "INR" in the last 72 hours.  Imaging No new brain imaging overnight  CT head without contrast 07/22/2022: No acute intracranial process.  ASSESSMENT AND PLAN: 36 year old male presenting from jail with seizure-like episodes.  Convulsions -LTM EEG did not show any ictal-interictal activity overnight  Recommendations -Patient has had prior video EEG without evidence of any ictal-interictal activity.  However, patients with epilepsy can have normal EEGs.  We did record episodes in the past without concomitant EEG change which were nonepileptic.  He currently has stressors (being taken with [employer]) which could have led to more nonepileptic events.  However, he also reports that he ran out of his Vimpat which could have led to an epileptic seizure -We will continue to monitor for another 24 hours.  At that point, if EEG remains normal, likely okay to discharge from neurology  standpoint at home AEDs. -Recommend clonazepam 2 mg ODT for breakthrough seizure lasting more than 5 minutes at the time of discharge  I have spent a total of 26   minutes with the patient reviewing hospital notes,  test results, labs and examining the patient as well as establishing an assessment and plan that was discussed personally with the patient.  > 50% of time was spent in direct patient care.     Lindie Spruce Epilepsy [facility] For questions after 5pm please refer to AMION to reach the Neurologist on call

## 2022-07-23 NOTE — Progress Notes (Signed)
  Transition of Care [facility]) Screening Note   Patient Details  Name: Gerod Caligiuri Date of Birth: February 04, 1986   Transition of Care [facility]) CM/SW Contact:    Kermit Balo, RN Phone Number: 07/23/2022, 1:34 PM   Pt is from jail. Plan is for him to return at d/c.  Transition of Care Department [facility]) has reviewed patient. We will continue to monitor patient advancement through interdisciplinary progression rounds. If new patient transition needs arise, please place a TOC consult.

## 2022-07-24 DIAGNOSIS — J449 Chronic obstructive pulmonary disease, unspecified: Secondary | ICD-10-CM | POA: Diagnosis not present

## 2022-07-24 DIAGNOSIS — Z86718 Personal history of other venous thrombosis and embolism: Secondary | ICD-10-CM | POA: Diagnosis not present

## 2022-07-24 DIAGNOSIS — R569 Unspecified convulsions: Secondary | ICD-10-CM | POA: Diagnosis not present

## 2022-07-24 DIAGNOSIS — Z7901 Long term (current) use of anticoagulants: Secondary | ICD-10-CM | POA: Diagnosis not present

## 2022-07-24 MED ORDER — PRAZOSIN HCL 1 MG PO CAPS: ORAL_CAPSULE | ORAL | 0 refills | Status: AC

## 2022-07-24 MED ORDER — PRAZOSIN HCL 1 MG PO CAPS: ORAL_CAPSULE | ORAL | 0 refills | Status: DC

## 2022-07-24 MED ORDER — PRAZOSIN HCL 1 MG PO CAPS
1.0000 mg | ORAL_CAPSULE | Freq: Every day | ORAL | Status: DC
  Filled 2022-07-24: qty 1

## 2022-07-24 NOTE — Procedures (Addendum)
Patient Name: Pharaoh Pio  MRN: [id number]  Epilepsy Attending: Charlsie Quest  Referring Physician/Provider: Gordy Councilman, MD  Duration: 07/23/2022 1344 to 07/24/2022 1040   Patient history: 36 year old male undergoing EEG for characterization of seizure-like episodes   Level of alertness: Awake, asleep   AEDs during EEG study: None   Technical aspects: This EEG study was done with scalp electrodes positioned according to the 10-20 International system of electrode placement. Electrical activity was reviewed with band pass filter of 1-70Hz , sensitivity of 7 uV/mm, display speed of 20mm/sec with a 60Hz  notched filter applied as appropriate. EEG data were recorded continuously and digitally stored.  Video monitoring was available and reviewed as appropriate.   Description: The posterior dominant rhythm consists of 9 Hz activity of moderate voltage (25-35 uV) seen predominantly in posterior head regions, symmetric and reactive to eye opening and eye closing. Sleep was characterized by vertex waves, sleep spindles (12 to 14 Hz), maximal frontocentral region.  Hyperventilation and photic stimulation were not performed.      IMPRESSION: This study is within normal limits. No seizures or epileptiform discharges were seen throughout the recording.   A normal interictal EEG does not exclude nor support the diagnosis of epilepsy.     Nitasha Jewel 

## 2022-07-24 NOTE — Discharge Summary (Addendum)
Family Medicine Teaching [facility] Discharge Summary  Patient name: John Flynn Medical record number: [id number] Date of birth: 1986/06/05 Age: 36 y.o. Gender: male Date of Admission: 07/21/2022  Date of Discharge: 07/24/2022 Admitting Physician: Tiffany Kocher, MD  Primary Care Provider: Patient, No Pcp Per Consultants: Neurology, psychology  Indication for Hospitalization: Seizure-like activity  Brief Hospital Course:  John Flynn is a 36 year old male who was admitted for multiple seizures-like this is in the setting of psychological stressors and poor sleep, highly suspicious for psychogenic nonepileptic seizures.  Past medical history includes seizure disorder, PTSD, psychogenic nonepileptic seizures, DVT.  Seizure-like activity (HCC) 8/21: Patient presented to the ED after 8 witnessed seizure-like episodes.  He also h hit his head, and is on blood thinners.  CT was negative for bleeding or ischemia.  Neurology was consulted.  Patient was admitted to hospital for 24-hour EEG monitoring, seizure versus PNES rule out.  We discontinued Keppra and Vimpat per neurology's recommendations, to better assess seizure-like episodes.  EEG with video monitoring was negative-and he was monitored for an additional 24 hours.  Per neurology we will restart Keppra and Vimpat.  Stable at time of discharge. No recurrent episodes while hospitalized.  Posttraumatic stress disorder He was poorly controlled on mirtazapine.  Repeatedly having dreams that would wake him up and elicit chest pain.  Psychology was consulted and will use prazosin at night for better control.  There will be a prazosin titration.  Continued mirtazapine.  Patient agreed to this plan.  Suspicion for mood disorder Psychiatry concerned the patient may have a bipolar disorder. Per psych we will not start mood stabilizer, as they feel risk of starting mood stabilizer outweigh the benefits at this time-considering many mood  stabilizers are incompatible with Xarelto and antipsychotic medication, would lower seizure threshold.  If mood stabilizers to be started outpatient Lamictal was the preferred agent-and Lamictal would replace Keppra on his medication list.  History of DVT History of DVT and pulmonary embolism requiring treatment.  He is on Xarelto and we continued his medication during his admission.  COPD (chronic obstructive pulmonary disease) (HCC) COPD stable, uses inhalers at home.  He received nebulized budesonide and albuterol as needed.  Continue home inhalers at discharge.  PCP follow-up recommendations 1.)  Continue vimpat for seizure prophylaxis.  Please ensure patient has adequate refills of Vimpat-running out of this medication could lead to more seizure-like activity and require further hospitalizations. 2.) Prazosin titration for PTSD. Do not exceed 3mg  until evaluated for side-effects. If tolerating 3 mg continue slow titration until PTSD controlled or side-effect intolerable.  Continue Remeron. 3.)  Work-up concern for possible mood disorder.  Discharge Diagnoses/Problem List:  Principal Problem:   Seizure-like activity (HCC) Active Problems:   Posttraumatic stress disorder   History of DVT (deep vein thrombosis)   COPD (chronic obstructive pulmonary disease) (HCC)   Seizure (HCC)   Witnessed seizure-like activity (HCC)   Acute chest pain   Chronic anticoagulation   [facility]  Discharge Condition: Stable  Discharge Exam: Per my progress note.   Vitals:   07/24/22 0858 07/24/22 1117  BP:  (!) 129/95  Pulse:  77  Resp:  16  Temp:  98.5 F (36.9 C)  SpO2: 99% 96%    Significant Procedures: EEG (results below)  Results/Tests Pending at Time of Discharge: None  Discharge Medications:  Allergies as of 07/24/2022       Reactions   Eggs Or Egg-derived Products Anaphylaxis   Shock Syndrome  Influenza Virus Vaccine Anaphylaxis   Aspirin Other (See  Comments)   Toxic Shock Syndrome   Nsaids Other (See Comments)   Toxic Shock Syndrome        Medication List     TAKE these medications    albuterol 108 (90 Base) MCG/ACT inhaler Commonly known as: VENTOLIN HFA Inhale 2 puffs into the lungs 2 (two) times daily as needed for wheezing or shortness of breath.   alum & mag hydroxide-simeth 200-200-20 MG/5ML suspension Commonly known as: MAALOX/MYLANTA Take 30 mLs by mouth 3 (three) times daily.   cetirizine 10 MG tablet Commonly known as: ZYRTEC Take 10 mg by mouth daily as needed for allergies.   ciclesonide 160 MCG/ACT inhaler Commonly known as: ALVESCO Inhale 2 puffs into the lungs 2 (two) times daily.   coal tar 0.5 % shampoo Commonly known as: NEUTROGENA T-GEL Apply 1 Application topically daily as needed (itching).   lacosamide 200 MG Tabs tablet Commonly known as: VIMPAT Take 200 mg by mouth 2 (two) times daily.   levETIRAcetam 500 MG tablet Commonly known as: KEPPRA Take 500 mg by mouth daily.   mirtazapine 15 MG tablet Commonly known as: REMERON Take 1 tablet (15 mg total) by mouth at bedtime.   prazosin 1 MG capsule Commonly known as: MINIPRESS Take 1 capsule (1 mg total) by mouth at bedtime for 7 days, THEN 2 capsules (2 mg total) at bedtime. Start taking on: July 24, 2022   rivaroxaban 20 MG Tabs tablet Commonly known as: XARELTO Take 20 mg by mouth daily with supper.   sodium chloride 0.65 % nasal spray Commonly known as: OCEAN Place 2 sprays into the nose 2 (two) times daily.        EEG 8/21-8/22: This study is within normal limits. No seizures or epileptiform discharges were seen throughout the recording.   A normal interictal EEG does not exclude nor support the diagnosis of epilepsy.  EEG 8/22-8/23: This study is within normal limits. No seizures or epileptiform discharges were seen throughout the recording.   A normal interictal EEG does not exclude nor support the diagnosis of  epilepsy.  Discharge Instructions: Please refer to Patient Instructions section of EMR for full details.  Patient was counseled important signs and symptoms that should prompt return to medical care, changes in medications, dietary instructions, activity restrictions, and follow up appointments.   Tiffany Kocher, MD 07/24/2022, 12:10 PM PGY-1, [facility]  FPTS Upper-Level Resident Addendum   I have independently interviewed and examined the patient. I have discussed the above with the original author and agree with their documentation. My edits for correction/addition/clarification are included where appropriate. Please see also any attending notes.   Sabino Dick, DO PGY-3, [facility] 07/24/2022 12:36 PM  FPTS Service pager: [phone] (text pages welcome through AMION)

## 2022-07-24 NOTE — Progress Notes (Signed)
vLTM discontinued.  [facility] notified.  No skin breakdown noted at all skin sites. ?

## 2022-07-24 NOTE — Discharge Instructions (Addendum)
Dear John Flynn,  Thank you for letting us participate in your care. You were hospitalized for seizure-like episodes and diagnosed with Seizure-like activity (HCC). You were monitored by EEG and did not have seizures in the hospital.  POST-HOSPITAL & CARE INSTRUCTIONS Please take your Keppra and Vimpat for seizure prevention. Please take prazosin 1 mg at night for 7 days, then take 2 mg for 30 days until you have been seen by your healthcare provider.  This is for sleep and anxiety.    Take care and be well!  Family Medicine Teaching Service Inpatient Team [place]  [facility]  [address] [place], [place] [address] [phone]

## 2022-07-24 NOTE — Progress Notes (Signed)
Subjective: No events overnight.  Patient denies any concerns.  ROS: negative except above  Examination  Vital signs in last 24 hours: Temp:  [97.6 F (36.4 C)-98.6 F (37 C)] 98.5 F (36.9 C) (08/23 1117) Pulse Rate:  [62-83] 77 (08/23 1117) Resp:  [16-20] 16 (08/23 1117) BP: (105-139)/(76-97) 129/95 (08/23 1117) SpO2:  [95 %-99 %] 96 % (08/23 1117)  General: lying in bed, NAD CVS: pulse-normal rate and rhythm RS: breathing comfortably Extremities: normal    Neuro: MS: Alert, oriented, follows commands CN: pupils equal and reactive,  EOMI, face symmetric, tongue midline, normal sensation over face, Motor: 5/5 strength in all 4 extremities  Basic Metabolic Panel: Recent Labs  Lab 07/21/22 2332  NA 139  K 4.0  CL 108  CO2 24  GLUCOSE 107*  BUN 10  CREATININE 1.19  CALCIUM 9.1  MG 2.3    CBC: Recent Labs  Lab 07/21/22 2332  WBC 8.3  NEUTROABS 5.8  HGB 15.7  HCT 47.4  MCV 84.3  PLT 264     Coagulation Studies: No results for input(s): "LABPROT", "INR" in the last 72 hours.  Imaging No new brain imaging overnight  ASSESSMENT AND PLAN: 36 year old male presenting from jail with seizure-like episodes.   Convulsions -LTM EEG did not show any ictal-interictal activity overnight   Recommendations -Patient has had prior video EEG without evidence of any ictal-interictal activity.  However, patients with epilepsy can have normal EEGs.  We did record episodes in the past without concomitant EEG change which were nonepileptic.  He currently has stressors (being taken with [employer]) which could have led to more nonepileptic events.  However, he also reports that he ran out of his Vimpat which could have led to an epileptic seizure -Recommend continuing home dose of Vimpat as well as Keppra -Recommend clonazepam 2 mg ODT for breakthrough seizure lasting more than 5 minutes at the time of discharge -Continue seizure precautions  Seizure precautions: Per [facility] statutes, patients with seizures are not allowed to drive until they have been seizure-free for six months and cleared by a physician    Use caution when using heavy equipment or power tools. Avoid working on ladders or at heights. Take showers instead of baths. Ensure the water temperature is not too high on the home water heater. Do not go swimming alone. Do not lock yourself in a room alone (i.e. bathroom). When caring for infants or small children, sit down when holding, feeding, or changing them to minimize risk of injury to the child in the event you have a seizure. Maintain good sleep hygiene. Avoid alcohol.    If patient has another seizure, call 911 and bring them back to the ED if: A.  The seizure lasts longer than 5 minutes.      B.  The patient doesn't wake shortly after the seizure or has new problems such as difficulty seeing, speaking or moving following the seizure C.  The patient was injured during the seizure D.  The patient has a temperature over 102 F (39C) E.  The patient vomited during the seizure and now is having trouble breathing    During the Seizure   - First, ensure adequate ventilation and place patients on the floor on their left side  Loosen clothing around the neck and ensure the airway is patent. If the patient is clenching the teeth, do not force the mouth open with any object as this can cause severe damage - Remove all  items from the surrounding that can be hazardous. The patient may be oblivious to what's happening and may not even know what he or she is doing. If the patient is confused and wandering, either gently guide him/her away and block access to outside areas - Reassure the individual and be comforting - Call 911. In most cases, the seizure ends before EMS arrives. However, there are cases when seizures may last over 3 to 5 minutes. Or the individual may have developed breathing difficulties or severe injuries. If a pregnant patient or a  person with diabetes develops a seizure, it is prudent to call an ambulance. - Finally, if the patient does not regain full consciousness, then call EMS. Most patients will remain confused for about 45 to 90 minutes after a seizure, so you must use judgment in calling for help.    After the Seizure (Postictal Stage)   After a seizure, most patients experience confusion, fatigue, muscle pain and/or a headache. Thus, one should permit the individual to sleep. For the next few days, reassurance is essential. Being calm and helping reorient the person is also of importance.   Most seizures are painless and end spontaneously. Seizures are not harmful to others but can lead to complications such as stress on the lungs, brain and the heart. Individuals with prior lung problems may develop labored breathing and respiratory distress.   I have spent a total of 37 minutes with the patient reviewing hospital notes,  test results, labs and examining the patient as well as establishing an assessment and plan that was discussed personally with the patient.  > 50% of time was spent in direct patient care.    Lindie Spruce Epilepsy [facility] For questions after 5pm please refer to AMION to reach the Neurologist on call

## 2022-07-24 NOTE — Progress Notes (Signed)
     Daily Progress Note Intern Pager: [phone]  Patient name: John Flynn Medical record number: [id number] Date of birth: 1986/10/23 Age: 36 y.o. Gender: male  Primary Care Provider: Patient, No Pcp Per Consultants: Neurology, psychology Code Status: Full  Pt Overview and Major Events to Date:  8/21: Admitted to med tele 8/22: EEG negative findings  Assessment and Plan: Sofracort is a 36 year old male presenting with multiple seizure-like episodes in setting of psychological stressors and poor sleep, high suspicion for psychogenic nonepileptic seizures.  Differential for this patient includes epilepsy.  Additionally we are managing his PTSD, and received appropriate psychology recommendations.  * Seizure-like activity (HCC) Patient received EEG overnight.  - Pending neurology read - neurology following, appreciate recs - hold lacosamide, keppra - seizure precautions  Posttraumatic stress disorder Poorly controlled PTSD.  However patient was in better spirits this morning. - We will start prazosin per psychology rec - Psychology will continue to follow at this time, we appreciate recs - Continue mirtazepine (Remeron) 15 mg  History of DVT (deep vein thrombosis) No swelling on exam today. -Continue home Xarelto 20mg  daily  COPD (chronic obstructive pulmonary disease) (HCC) Chronic, stable. -Hold home inhalers - Budesonide/albuterol nebulizers as needed  Acute chest pain Resolved.   FEN/GI: Regular diet PPx: Xarelto Dispo: [facility].   Subjective:  Patient reports that he feels ready to be discharged.  He was in high spirits.  He has no acute concerns.  Objective: Temp:  [97.6 F (36.4 C)-98.6 F (37 C)] 98.5 F (36.9 C) (08/23 1117) Pulse Rate:  [62-83] 77 (08/23 1117) Resp:  [16-20] 16 (08/23 1117) BP: (105-139)/(76-97) 129/95 (08/23 1117) SpO2:  [95 %-99 %] 96 % (08/23 1117) Physical Exam: General: Nonacute  distress Cardiovascular: Regular rate and rhythm, no MRG Respiratory: CTAB, normal work of breathing Psych: Positive affect Laboratory: Most recent CBC Lab Results  Component Value Date   WBC 8.3 07/21/2022   HGB 15.7 07/21/2022   HCT 47.4 07/21/2022   MCV 84.3 07/21/2022   PLT 264 07/21/2022   Most recent BMP    Latest Ref Rng & Units 07/21/2022   11:32 PM  BMP  Glucose 70 - 99 mg/dL 07/23/2022   BUN 6 - 20 mg/dL 10   Creatinine 536 - 1.24 mg/dL 6.44   Sodium 0.34 - 742 mmol/L 139   Potassium 3.5 - 5.1 mmol/L 4.0   Chloride 98 - 111 mmol/L 108   CO2 22 - 32 mmol/L 24   Calcium 8.9 - 10.3 mg/dL 9.1     595, MD 07/24/2022, 11:26 AM  PGY-1, [facility] FPTS Intern pager: [phone], text pages welcome Secure chat group [facility] Teaching Service

## 2022-07-24 NOTE — TOC Transition Note (Signed)
Transition of Care [facility]) - CM/SW Discharge Note   Patient Details  Name: John Flynn MRN: [id number] Date of Birth: 12-25-1985  [facility] CM/SW Contact:  Kermit Balo, RN Phone Number: 07/24/2022, 1:06 PM   Clinical Narrative:    Pt [facility] back to jail. The guards at the bedside will provide transport back to the jail.   Final next level of care: Other (comment) (Jail) Barriers to Discharge: No Barriers Identified   Patient Goals and CMS Choice        Discharge Placement                       Discharge Plan and Services                                     Social Determinants of Health (SDOH) Interventions     Readmission Risk Interventions     No data to display
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
## 2023-01-17 ENCOUNTER — Emergency Department (HOSPITAL_COMMUNITY)

## 2023-01-17 ENCOUNTER — Other Ambulatory Visit: Payer: Self-pay

## 2023-01-17 ENCOUNTER — Emergency Department (HOSPITAL_COMMUNITY)
Admission: EM | Admit: 2023-01-17 | Discharge: 2023-01-17 | Attending: Emergency Medicine | Admitting: Emergency Medicine

## 2023-01-17 DIAGNOSIS — S0093XA Contusion of unspecified part of head, initial encounter: Secondary | ICD-10-CM | POA: Diagnosis not present

## 2023-01-17 DIAGNOSIS — X58XXXA Exposure to other specified factors, initial encounter: Secondary | ICD-10-CM | POA: Insufficient documentation

## 2023-01-17 DIAGNOSIS — Z7901 Long term (current) use of anticoagulants: Secondary | ICD-10-CM | POA: Insufficient documentation

## 2023-01-17 DIAGNOSIS — S0990XA Unspecified injury of head, initial encounter: Secondary | ICD-10-CM | POA: Diagnosis present

## 2023-01-17 DIAGNOSIS — R251 Tremor, unspecified: Secondary | ICD-10-CM

## 2023-01-17 DIAGNOSIS — Z87898 Personal history of other specified conditions: Secondary | ICD-10-CM

## 2023-01-17 LAB — BASIC METABOLIC PANEL
Anion gap: 8 (ref 5–15)
BUN: 10 mg/dL (ref 6–20)
CO2: 23 mmol/L (ref 22–32)
Calcium: 8.6 mg/dL — ABNORMAL LOW (ref 8.9–10.3)
Chloride: 106 mmol/L (ref 98–111)
Creatinine, Ser: 1.16 mg/dL (ref 0.61–1.24)
GFR, Estimated: >60 mL/min (ref >60)
Glucose, Bld: 102 mg/dL — ABNORMAL HIGH (ref 70–99)
Potassium: 3.7 mmol/L (ref 3.5–5.1)
Sodium: 137 mmol/L (ref 135–145)

## 2023-01-17 LAB — CBC
HCT: 49.4 % (ref 39.0–52.0)
Hemoglobin: 15.8 g/dL (ref 13.0–17.0)
MCH: 27.2 pg (ref 26.0–34.0)
MCHC: 32 g/dL (ref 30.0–36.0)
MCV: 85 fL (ref 80.0–100.0)
Platelets: 251 10*3/uL (ref 150–400)
RBC: 5.81 MIL/uL (ref 4.22–5.81)
RDW: 13.2 % (ref 11.5–15.5)
WBC: 4.8 10*3/uL (ref 4.0–10.5)
nRBC: 0 % (ref 0.0–0.2)

## 2023-01-17 MED ORDER — LEVETIRACETAM IN NACL 1500 MG/100ML IV SOLN: 1500.0000 mg | Freq: Once | INTRAVENOUS | Status: DC

## 2023-01-17 MED ORDER — LORAZEPAM 1 MG PO TABS
1.0000 mg | ORAL_TABLET | Freq: Once | ORAL | Status: AC
  Administered 2023-01-17: 1 mg via ORAL
  Filled 2023-01-17: qty 1

## 2023-01-17 MED ORDER — SODIUM CHLORIDE 0.9 % IV SOLN
200.0000 mg | Freq: Once | INTRAVENOUS | Status: AC
  Administered 2023-01-17: 200 mg via INTRAVENOUS
  Filled 2023-01-17: qty 20

## 2023-01-17 NOTE — ED Provider Notes (Signed)
[facility] Provider Note   CSN: [id number] Arrival date & time: 01/17/23  1235     History  Chief Complaint  Patient presents with   Seizures    John Flynn is a 37 y.o. male.  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 Pt is on xarelto. Pt indicates jail had not gotten his vimpat to him in the past 3-4 days. Denies other change in meds. States normal appetite, normal po intake. No acute physical illness. No preceding headaches. No chest pain or discomfort. No sob or unusual doe. No abd pain or nvd. No gu c/o. No neck or back pain. No extremity pain or injury. No fever or chills.   The history is provided by the patient, medical records and the EMS personnel.  Seizures      Home Medications Prior to Admission medications   Medication Sig Start Date End Date Taking? Authorizing Provider  albuterol (VENTOLIN HFA) 108 (90 Base) MCG/ACT inhaler Inhale 2 puffs into the lungs 2 (two) times daily as needed for wheezing or shortness of breath.    [provider]  alum & mag hydroxide-simeth (MAALOX/MYLANTA) 200-200-20 MG/5ML suspension Take 30 mLs by mouth 3 (three) times daily.    [provider]  cetirizine (ZYRTEC) 10 MG tablet Take 10 mg by mouth daily as needed for allergies.    [provider]  ciclesonide (ALVESCO) 160 MCG/ACT inhaler Inhale 2 puffs into the lungs 2 (two) times daily.    [provider]  coal tar (NEUTROGENA T-GEL) 0.5 % shampoo Apply 1 Application topically daily as needed (itching).    [provider]  lacosamide (VIMPAT) 200 MG TABS tablet Take 200 mg by mouth 2 (two) times daily. 12/15/21   [provider]  levETIRAcetam (KEPPRA) 500 MG tablet Take 500 mg by mouth daily.    [provider]  mirtazapine (REMERON) 15 MG tablet Take 1 tablet (15 mg total) by mouth at  bedtime. 05/27/22 07/22/22  France Ravens, MD  prazosin (MINIPRESS) 1 MG capsule Take 1 capsule (1 mg total) by mouth at bedtime for 7 days, THEN 2 capsules (2 mg total) at bedtime. 07/24/22 08/30/22  Sharion Settler, DO  rivaroxaban (XARELTO) 20 MG TABS tablet Take 20 mg by mouth daily with supper.    [provider]  sodium chloride (OCEAN) 0.65 % nasal spray Place 2 sprays into the nose 2 (two) times daily.    [provider]  phenytoin (DILANTIN) 300 MG ER capsule Take 300 mg by mouth 2 (two) times daily.  11/21/20  [provider]      Allergies    Eggs or egg-derived products, Influenza virus vaccine, Aspirin, and Nsaids    Review of Systems   Review of Systems  Constitutional:  Negative for fever.  HENT:  Negative for sore throat.   Eyes:  Negative for redness.  Respiratory:  Negative for shortness of breath.   Cardiovascular:  Negative for chest pain.  Gastrointestinal:  Negative for abdominal pain and vomiting.  Genitourinary:  Negative for dysuria and flank pain.  Musculoskeletal:  Negative for back pain and neck pain.  Skin:  Negative for rash.  Neurological:  Positive for seizures. Negative for headaches.  Hematological:        On xarelto.   Psychiatric/Behavioral:  Negative for confusion.     Physical Exam Updated Vital Signs BP (!) 128/100 (  BP Location: Right Arm)   Pulse 93   Temp 97.6 F (36.4 C) (Oral)   Resp 20   Ht 1.753 m (5' 9"$ )   Wt 99.8 kg   SpO2 94%   BMI 32.49 kg/m  Physical Exam Vitals and nursing note reviewed.  Constitutional:      Appearance: Normal appearance. He is well-developed.  HENT:     Head: Atraumatic.     Nose: Nose normal.     Mouth/Throat:     Mouth: Mucous membranes are moist.     Pharynx: Oropharynx is clear.     Comments: No oral trauma.  Eyes:     General: No scleral icterus.    Conjunctiva/sclera: Conjunctivae normal.     Pupils: Pupils are equal, round, and reactive to light.  Neck:      Vascular: No carotid bruit.     Trachea: No tracheal deviation.     Comments: No stiffness or rigidity.  Cardiovascular:     Rate and Rhythm: Normal rate and regular rhythm.     Pulses: Normal pulses.     Heart sounds: Normal heart sounds. No murmur heard.    No friction rub. No gallop.  Pulmonary:     Effort: Pulmonary effort is normal. No accessory muscle usage or respiratory distress.     Breath sounds: Normal breath sounds.  Abdominal:     General: Bowel sounds are normal. There is no distension.     Palpations: Abdomen is soft.     Tenderness: There is no abdominal tenderness. There is no guarding.  Genitourinary:    Comments: No cva tenderness. Musculoskeletal:        General: No swelling.     Cervical back: Normal range of motion and neck supple. No rigidity or tenderness.     Comments: CTLS spine, non tender, aligned, no step off. Good rom bil extremities without pain or focal bony tenderness.   Skin:    General: Skin is warm and dry.     Findings: No rash.  Neurological:     Mental Status: He is alert.     Comments: Alert, speech clear. Motor/sens grossly intact bil.   Psychiatric:        Mood and Affect: Mood normal.     ED Results / Procedures / Treatments   Labs (all labs ordered are listed, but only abnormal results are displayed) Results for orders placed or performed during the hospital encounter of 0000000  Basic metabolic panel  Result Value Ref Range   Sodium 137 135 - 145 mmol/L   Potassium 3.7 3.5 - 5.1 mmol/L   Chloride 106 98 - 111 mmol/L   CO2 23 22 - 32 mmol/L   Glucose, Bld 102 (H) 70 - 99 mg/dL   BUN 10 6 - 20 mg/dL   Creatinine, Ser 1.16 0.61 - 1.24 mg/dL   Calcium 8.6 (L) 8.9 - 10.3 mg/dL   GFR, Estimated >60 >60 mL/min   Anion gap 8 5 - 15  CBC  Result Value Ref Range   WBC 4.8 4.0 - 10.5 K/uL   RBC 5.81 4.22 - 5.81 MIL/uL   Hemoglobin 15.8 13.0 - 17.0 g/dL   HCT 49.4 39.0 - 52.0 %   MCV 85.0 80.0 - 100.0 fL   MCH 27.2 26.0 - 34.0  pg   MCHC 32.0 30.0 - 36.0 g/dL   RDW 13.2 11.5 - 15.5 %   Platelets 251 150 - 400 K/uL   nRBC 0.0 0.0 -  0.2 %   CT Head Wo Contrast  Result Date: 01/17/2023 CLINICAL DATA:  Head trauma seizure. EXAM: CT HEAD WITHOUT CONTRAST TECHNIQUE: Contiguous axial images were obtained from the base of the skull through the vertex without intravenous contrast. RADIATION DOSE REDUCTION: This exam was performed according to the departmental dose-optimization program which includes automated exposure control, adjustment of the mA and/or kV according to patient size and/or use of iterative reconstruction technique. COMPARISON:  None Available. FINDINGS: Brain: No acute intracranial hemorrhage. No focal mass lesion. No CT evidence of acute infarction. No midline shift or mass effect. No hydrocephalus. Basilar cisterns are patent. Vascular: No hyperdense vessel or unexpected calcification. Skull: Normal. Negative for fracture or focal lesion. Sinuses/Orbits: Paranasal sinuses and mastoid air cells are clear. Orbits are clear. Other: None. IMPRESSION: 1. No intracranial trauma. 2. No seizure focus identified Electronically Signed   By: Suzy Bouchard M.D.   On: 01/17/2023 14:29    EKG EKG Interpretation  Date/Time:  Friday January 17 2023 12:45:35 EST Ventricular Rate:  88 PR Interval:  176 QRS Duration: 79 QT Interval:  352 QTC Calculation: 426 R Axis:   33 Text Interpretation: Sinus rhythm Nonspecific ST abnormality No significant change since last tracing Confirmed by Lajean Saver [phone]) on 01/17/2023 2:55:06 PM  Radiology CT Head Wo Contrast  Result Date: 01/17/2023 CLINICAL DATA:  Head trauma seizure. EXAM: CT HEAD WITHOUT CONTRAST TECHNIQUE: Contiguous axial images were obtained from the base of the skull through the vertex without intravenous contrast. RADIATION DOSE REDUCTION: This exam was performed according to the departmental dose-optimization program which includes automated exposure control,  adjustment of the mA and/or kV according to patient size and/or use of iterative reconstruction technique. COMPARISON:  None Available. FINDINGS: Brain: No acute intracranial hemorrhage. No focal mass lesion. No CT evidence of acute infarction. No midline shift or mass effect. No hydrocephalus. Basilar cisterns are patent. Vascular: No hyperdense vessel or unexpected calcification. Skull: Normal. Negative for fracture or focal lesion. Sinuses/Orbits: Paranasal sinuses and mastoid air cells are clear. Orbits are clear. Other: None. IMPRESSION: 1. No intracranial trauma. 2. No seizure focus identified Electronically Signed   By: Suzy Bouchard M.D.   On: 01/17/2023 14:29    Procedures Procedures    Medications Ordered in ED Medications  LORazepam (ATIVAN) tablet 1 mg (1 mg Oral Given 01/17/23 1345)  lacosamide (VIMPAT) 200 mg in sodium chloride 0.9 % 25 mL IVPB (200 mg Intravenous New Bag/Given 01/17/23 1414)    ED Course/ Medical Decision Making/ A&P                             Medical Decision Making Problems Addressed: Contusion of head, initial encounter: acute illness or injury with systemic symptoms that poses a threat to life or bodily functions Episode of shaking: acute illness or injury with systemic symptoms History of seizures: chronic illness or injury with exacerbation, progression, or side effects of treatment that poses a threat to life or bodily functions  Amount and/or Complexity of Data Reviewed Independent Historian: EMS    Details: hx External Data Reviewed: notes. Labs: ordered. Decision-making details documented in ED Course. Radiology: ordered and independent interpretation performed. Decision-making details documented in ED Course.  Risk Prescription drug management. Decision regarding hospitalization.   Iv ns. Continuous pulse ox and cardiac monitoring. Labs ordered/sent. Imaging ordered.   Differential diagnosis includes seizures, pseudoseizure, syncope,  anemia, etc. Dispo decision including potential need for admission  considered - will get labs and imaging and reassess.   Ativan given (pt mildly anxious, ?recent sz).   Initially loading dose keppra ordered, pt declines, indicates 'made him violent' in past, requests vimpat, dose given.   Reviewed nursing notes and prior charts for additional history. External reports reviewed - pt noted to have two prior video eeg sessions c/w non-epileptic sz like shaking activity.   Additional history from: EMS.   Cardiac monitor: sinus rhythm, rate 90.  Labs reviewed/interpreted by me - chem normal.   CT reviewed/interpreted by me - no hem.   Called to CT as 'possible sz', pt noted to be transiently less responsive to staff although still responsive, with some shaking/tremulousness - I went to CT, no generalized clonic tonic seizure activity noted, no tachycardia, no post-ictal period - episode appeared most c/w non-epileptic event.   Po fluids/food. Recheck pt, no c/o, no pain.   Pt currently appears stable for d/c.   Rec taking meds/not missing doses, outpatient neurology f/u.  Return precautions provided.            Final Clinical Impression(s) / ED Diagnoses Final diagnoses:  History of seizures  Contusion of head, initial encounter  Episode of shaking    Rx / DC Orders ED Discharge Orders     None         Lajean Saver, MD 01/17/23 1455

## 2023-01-17 NOTE — ED Triage Notes (Signed)
Correction- EMS gave 52m Versed.

## 2023-01-17 NOTE — Discharge Instructions (Signed)
It was our pleasure to provide your ER care today - we hope that you feel better.  Take your seizure medication as prescribed by your doctor - make sure not to miss or skip doses.  No driving, operating heavy machinery, swimming or other seizure restricted activity until cleared to do so by your doctor.  Follow up with your doctor/neurologist in the next 1-2 weeks.  Return to ER if worse, new  symptoms, fevers, recurrent/persistent seizures, or other concern.
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
## 2024-04-30 ENCOUNTER — Emergency Department (HOSPITAL_COMMUNITY)
Admission: EM | Admit: 2024-04-30 | Discharge: 2024-05-01 | Disposition: A | Discharge status: Home / Self Care | Attending: Emergency Medicine | Admitting: Emergency Medicine

## 2024-04-30 DIAGNOSIS — R569 Unspecified convulsions: Secondary | ICD-10-CM | POA: Diagnosis present

## 2024-04-30 DIAGNOSIS — J45909 Unspecified asthma, uncomplicated: Secondary | ICD-10-CM | POA: Insufficient documentation

## 2024-04-30 DIAGNOSIS — Z8673 Personal history of transient ischemic attack (TIA), and cerebral infarction without residual deficits: Secondary | ICD-10-CM | POA: Insufficient documentation

## 2024-04-30 DIAGNOSIS — I10 Essential (primary) hypertension: Secondary | ICD-10-CM | POA: Insufficient documentation

## 2024-04-30 LAB — CBC WITH DIFFERENTIAL/PLATELET
Abs Immature Granulocytes: 0.03 10*3/uL (ref 0.00–0.07)
Basophils Absolute: 0 10*3/uL (ref 0.0–0.1)
Basophils Relative: 1 %
Eosinophils Absolute: 0 10*3/uL (ref 0.0–0.5)
Eosinophils Relative: 1 %
HCT: 47.5 % (ref 39.0–52.0)
Hemoglobin: 15.1 g/dL (ref 13.0–17.0)
Immature Granulocytes: 1 %
Lymphocytes Relative: 34 %
Lymphs Abs: 1.8 10*3/uL (ref 0.7–4.0)
MCH: 27.1 pg (ref 26.0–34.0)
MCHC: 31.8 g/dL (ref 30.0–36.0)
MCV: 85.3 fL (ref 80.0–100.0)
Monocytes Absolute: 0.3 10*3/uL (ref 0.1–1.0)
Monocytes Relative: 6 %
Neutro Abs: 3.1 10*3/uL (ref 1.7–7.7)
Neutrophils Relative %: 57 %
Platelets: 219 10*3/uL (ref 150–400)
RBC: 5.57 MIL/uL (ref 4.22–5.81)
RDW: 14.3 % (ref 11.5–15.5)
WBC: 5.4 10*3/uL (ref 4.0–10.5)
nRBC: 0 % (ref 0.0–0.2)

## 2024-04-30 LAB — URINALYSIS, ROUTINE W REFLEX MICROSCOPIC
Bilirubin Urine: NEGATIVE
Glucose, UA: NEGATIVE mg/dL
Hgb urine dipstick: NEGATIVE
Ketones, ur: NEGATIVE mg/dL
Leukocytes,Ua: NEGATIVE
Nitrite: NEGATIVE
Protein, ur: NEGATIVE mg/dL
Specific Gravity, Urine: 1.028 (ref 1.005–1.030)
pH: 5 (ref 5.0–8.0)

## 2024-04-30 LAB — COMPREHENSIVE METABOLIC PANEL WITH GFR
ALT: 20 U/L (ref 0–44)
AST: 20 U/L (ref 15–41)
Albumin: 3.1 g/dL — ABNORMAL LOW (ref 3.5–5.0)
Alkaline Phosphatase: 52 U/L (ref 38–126)
Anion gap: 8 (ref 5–15)
BUN: 14 mg/dL (ref 6–20)
CO2: 23 mmol/L (ref 22–32)
Calcium: 8.7 mg/dL — ABNORMAL LOW (ref 8.9–10.3)
Chloride: 111 mmol/L (ref 98–111)
Creatinine, Ser: 1.5 mg/dL — ABNORMAL HIGH (ref 0.61–1.24)
GFR, Estimated: >60 mL/min (ref >60)
Glucose, Bld: 92 mg/dL (ref 70–99)
Potassium: 4.1 mmol/L (ref 3.5–5.1)
Sodium: 142 mmol/L (ref 135–145)
Total Bilirubin: 0.7 mg/dL (ref 0.0–1.2)
Total Protein: 6.1 g/dL — ABNORMAL LOW (ref 6.5–8.1)

## 2024-04-30 LAB — RAPID URINE DRUG SCREEN, HOSP PERFORMED
Amphetamines: NOT DETECTED
Barbiturates: NOT DETECTED
Benzodiazepines: POSITIVE — AB
Cocaine: NOT DETECTED
Opiates: NOT DETECTED
Tetrahydrocannabinol: NOT DETECTED

## 2024-04-30 LAB — CBG MONITORING, ED: Glucose-Capillary: 98 mg/dL (ref 70–99)

## 2024-04-30 LAB — MAGNESIUM: Magnesium: 2.3 mg/dL (ref 1.7–2.4)

## 2024-04-30 MED ORDER — LEVETIRACETAM (KEPPRA) 500 MG/5 ML ADULT IV PUSH
4500.0000 mg | Freq: Once | INTRAVENOUS | Status: AC
  Administered 2024-04-30: 4500 mg via INTRAVENOUS
  Filled 2024-04-30: qty 45

## 2024-04-30 MED ORDER — SODIUM CHLORIDE 0.9 % IV BOLUS: 1000.0000 mL | Freq: Once | INTRAVENOUS | Status: DC

## 2024-04-30 MED ORDER — LACTATED RINGERS IV BOLUS
1000.0000 mL | Freq: Once | INTRAVENOUS | Status: AC
  Administered 2024-04-30: 1000 mL via INTRAVENOUS

## 2024-04-30 NOTE — ED Notes (Signed)
 Pt given sandwich and water per MD verbal order to this RN. Pt also given urinal and informed a urine sample has been ordered

## 2024-04-30 NOTE — ED Notes (Signed)
 Father Deval Mroczka [phone] or [phone] would like an update asap

## 2024-04-30 NOTE — ED Triage Notes (Signed)
 [facility]. Pt had 1 witnessed seizure that lasted about 3 minutes. Pt had another seizure with EMS, lasted less than a minute. Upon EMS arrival pt GCS 3, upon arrival pt is responsive to voice. EMS gave 7.5 versed. VSS per EMS with CBG 91. Pt has hx DVTs and blood thinner use.

## 2024-04-30 NOTE — Discharge Instructions (Signed)
 John Flynn:  Thank you for allowing us  to take care of you today.  We hope you begin feeling better soon. You were seen today for seizure.  This is likely in the setting of stress.  We are encouraging you to call your neurologist Monday morning.  Please continue your home antiepileptic drugs.  To-Do:  Please follow-up with your primary doctor within the next 2-3 days. It is important that you review any labs or imaging results (if any) that you had today with them. Your preliminary imaging results (if any) are attached. Please return to the Emergency Department or call 911 if you experience chest pain, shortness of breath, severe pain, severe fever, altered mental status, or have any reason to think that you need emergency medical care.  Thank you again.  Hope you feel better soon.  Arminda Landmark, MD Department of Emergency Medicine
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
## 2024-05-01 NOTE — ED Notes (Signed)
 [facility] called prior to DC since pt is staying there. Pt given cab voucher prior to discharge. [employer] called as well.

## 2024-06-03 ENCOUNTER — Other Ambulatory Visit: Payer: Self-pay

## 2024-06-03 ENCOUNTER — Encounter (HOSPITAL_COMMUNITY): Payer: Self-pay

## 2024-06-03 ENCOUNTER — Emergency Department (HOSPITAL_COMMUNITY)

## 2024-06-03 ENCOUNTER — Emergency Department (HOSPITAL_COMMUNITY): Admission: EM | Admit: 2024-06-03 | Discharge: 2024-06-03 | Disposition: A | Discharge status: Home / Self Care

## 2024-06-03 DIAGNOSIS — Z7901 Long term (current) use of anticoagulants: Secondary | ICD-10-CM | POA: Insufficient documentation

## 2024-06-03 DIAGNOSIS — G40909 Epilepsy, unspecified, not intractable, without status epilepticus: Secondary | ICD-10-CM | POA: Diagnosis not present

## 2024-06-03 DIAGNOSIS — R569 Unspecified convulsions: Secondary | ICD-10-CM | POA: Diagnosis present

## 2024-06-03 DIAGNOSIS — Z79899 Other long term (current) drug therapy: Secondary | ICD-10-CM | POA: Diagnosis not present

## 2024-06-03 DIAGNOSIS — I10 Essential (primary) hypertension: Secondary | ICD-10-CM | POA: Insufficient documentation

## 2024-06-03 LAB — CBC WITH DIFFERENTIAL/PLATELET
Abs Immature Granulocytes: 0.03 10*3/uL (ref 0.00–0.07)
Basophils Absolute: 0 10*3/uL (ref 0.0–0.1)
Basophils Relative: 1 %
Eosinophils Absolute: 0 10*3/uL (ref 0.0–0.5)
Eosinophils Relative: 1 %
HCT: 49.2 % (ref 39.0–52.0)
Hemoglobin: 15.6 g/dL (ref 13.0–17.0)
Immature Granulocytes: 1 %
Lymphocytes Relative: 29 %
Lymphs Abs: 1.9 10*3/uL (ref 0.7–4.0)
MCH: 26.9 pg (ref 26.0–34.0)
MCHC: 31.7 g/dL (ref 30.0–36.0)
MCV: 84.7 fL (ref 80.0–100.0)
Monocytes Absolute: 0.3 10*3/uL (ref 0.1–1.0)
Monocytes Relative: 5 %
Neutro Abs: 4.2 10*3/uL (ref 1.7–7.7)
Neutrophils Relative %: 63 %
Platelets: 240 10*3/uL (ref 150–400)
RBC: 5.81 MIL/uL (ref 4.22–5.81)
RDW: 13.5 % (ref 11.5–15.5)
WBC: 6.5 10*3/uL (ref 4.0–10.5)
nRBC: 0 % (ref 0.0–0.2)

## 2024-06-03 LAB — URINALYSIS, ROUTINE W REFLEX MICROSCOPIC
Bilirubin Urine: NEGATIVE
Glucose, UA: NEGATIVE mg/dL
Hgb urine dipstick: NEGATIVE
Ketones, ur: NEGATIVE mg/dL
Leukocytes,Ua: NEGATIVE
Nitrite: NEGATIVE
Protein, ur: NEGATIVE mg/dL
Specific Gravity, Urine: 1.023 (ref 1.005–1.030)
pH: 7 (ref 5.0–8.0)

## 2024-06-03 LAB — AMMONIA: Ammonia: 48 umol/L — ABNORMAL HIGH (ref 9–35)

## 2024-06-03 LAB — COMPREHENSIVE METABOLIC PANEL WITH GFR
ALT: 19 U/L (ref 0–44)
AST: 19 U/L (ref 15–41)
Albumin: 3.2 g/dL — ABNORMAL LOW (ref 3.5–5.0)
Alkaline Phosphatase: 55 U/L (ref 38–126)
Anion gap: 11 (ref 5–15)
BUN: 10 mg/dL (ref 6–20)
CO2: 23 mmol/L (ref 22–32)
Calcium: 8.8 mg/dL — ABNORMAL LOW (ref 8.9–10.3)
Chloride: 105 mmol/L (ref 98–111)
Creatinine, Ser: 1.39 mg/dL — ABNORMAL HIGH (ref 0.61–1.24)
GFR, Estimated: >60 mL/min (ref >60)
Glucose, Bld: 126 mg/dL — ABNORMAL HIGH (ref 70–99)
Potassium: 3.5 mmol/L (ref 3.5–5.1)
Sodium: 139 mmol/L (ref 135–145)
Total Bilirubin: 0.5 mg/dL (ref 0.0–1.2)
Total Protein: 6.2 g/dL — ABNORMAL LOW (ref 6.5–8.1)

## 2024-06-03 LAB — I-STAT CHEM 8, ED
BUN: 12 mg/dL (ref 6–20)
Calcium, Ion: 1.17 mmol/L (ref 1.15–1.40)
Chloride: 107 mmol/L (ref 98–111)
Creatinine, Ser: 1.4 mg/dL — ABNORMAL HIGH (ref 0.61–1.24)
Glucose, Bld: 122 mg/dL — ABNORMAL HIGH (ref 70–99)
HCT: 49 % (ref 39.0–52.0)
Hemoglobin: 16.7 g/dL (ref 13.0–17.0)
Potassium: 3.5 mmol/L (ref 3.5–5.1)
Sodium: 142 mmol/L (ref 135–145)
TCO2: 21 mmol/L — ABNORMAL LOW (ref 22–32)

## 2024-06-03 LAB — RAPID URINE DRUG SCREEN, HOSP PERFORMED
Amphetamines: NOT DETECTED
Barbiturates: NOT DETECTED
Benzodiazepines: POSITIVE — AB
Cocaine: NOT DETECTED
Opiates: NOT DETECTED
Tetrahydrocannabinol: NOT DETECTED

## 2024-06-03 LAB — VALPROIC ACID LEVEL: Valproic Acid Lvl: 19 ug/mL — ABNORMAL LOW (ref 50–100)

## 2024-06-03 LAB — CBG MONITORING, ED: Glucose-Capillary: 118 mg/dL — ABNORMAL HIGH (ref 70–99)

## 2024-06-03 MED ORDER — SODIUM CHLORIDE 0.9 % IV BOLUS
500.0000 mL | Freq: Once | INTRAVENOUS | Status: AC
  Administered 2024-06-03: 500 mL via INTRAVENOUS
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
## 2024-08-11 ENCOUNTER — Emergency Department (HOSPITAL_COMMUNITY)

## 2024-08-11 ENCOUNTER — Emergency Department (HOSPITAL_COMMUNITY)
Admission: EM | Admit: 2024-08-11 | Discharge: 2024-08-12 | Disposition: A | Discharge status: Home / Self Care | Attending: Emergency Medicine | Admitting: Emergency Medicine

## 2024-08-11 ENCOUNTER — Other Ambulatory Visit: Payer: Self-pay

## 2024-08-11 DIAGNOSIS — R569 Unspecified convulsions: Secondary | ICD-10-CM | POA: Insufficient documentation

## 2024-08-11 DIAGNOSIS — I10 Essential (primary) hypertension: Secondary | ICD-10-CM | POA: Insufficient documentation

## 2024-08-11 DIAGNOSIS — Z91199 Patient's noncompliance with other medical treatment and regimen due to unspecified reason: Secondary | ICD-10-CM | POA: Diagnosis not present

## 2024-08-11 DIAGNOSIS — Z7901 Long term (current) use of anticoagulants: Secondary | ICD-10-CM | POA: Diagnosis not present

## 2024-08-11 LAB — CBC WITH DIFFERENTIAL/PLATELET
Abs Immature Granulocytes: 0.03 K/uL (ref 0.00–0.07)
Basophils Absolute: 0 K/uL (ref 0.0–0.1)
Basophils Relative: 0 %
Eosinophils Absolute: 0 K/uL (ref 0.0–0.5)
Eosinophils Relative: 1 %
HCT: 46.4 % (ref 39.0–52.0)
Hemoglobin: 14.9 g/dL (ref 13.0–17.0)
Immature Granulocytes: 0 %
Lymphocytes Relative: 22 %
Lymphs Abs: 1.7 K/uL (ref 0.7–4.0)
MCH: 27.1 pg (ref 26.0–34.0)
MCHC: 32.1 g/dL (ref 30.0–36.0)
MCV: 84.5 fL (ref 80.0–100.0)
Monocytes Absolute: 0.5 K/uL (ref 0.1–1.0)
Monocytes Relative: 6 %
Neutro Abs: 5.4 K/uL (ref 1.7–7.7)
Neutrophils Relative %: 71 %
Platelets: 263 K/uL (ref 150–400)
RBC: 5.49 MIL/uL (ref 4.22–5.81)
RDW: 13.3 % (ref 11.5–15.5)
WBC: 7.7 K/uL (ref 4.0–10.5)
nRBC: 0 % (ref 0.0–0.2)

## 2024-08-11 LAB — URINALYSIS, ROUTINE W REFLEX MICROSCOPIC
Bilirubin Urine: NEGATIVE
Glucose, UA: NEGATIVE mg/dL
Hgb urine dipstick: NEGATIVE
Ketones, ur: NEGATIVE mg/dL
Leukocytes,Ua: NEGATIVE
Nitrite: NEGATIVE
Protein, ur: NEGATIVE mg/dL
Specific Gravity, Urine: 1.009 (ref 1.005–1.030)
pH: 7 (ref 5.0–8.0)

## 2024-08-11 LAB — COMPREHENSIVE METABOLIC PANEL WITH GFR
ALT: 15 U/L (ref 0–44)
AST: 24 U/L (ref 15–41)
Albumin: 3.5 g/dL (ref 3.5–5.0)
Alkaline Phosphatase: 65 U/L (ref 38–126)
Anion gap: 11 (ref 5–15)
BUN: 6 mg/dL (ref 6–20)
CO2: 26 mmol/L (ref 22–32)
Calcium: 8.9 mg/dL (ref 8.9–10.3)
Chloride: 104 mmol/L (ref 98–111)
Creatinine, Ser: 1.33 mg/dL — ABNORMAL HIGH (ref 0.61–1.24)
GFR, Estimated: >60 mL/min (ref >60)
Glucose, Bld: 94 mg/dL (ref 70–99)
Potassium: 3.8 mmol/L (ref 3.5–5.1)
Sodium: 141 mmol/L (ref 135–145)
Total Bilirubin: 1.2 mg/dL (ref 0.0–1.2)
Total Protein: 6.7 g/dL (ref 6.5–8.1)

## 2024-08-11 LAB — VALPROIC ACID LEVEL: Valproic Acid Lvl: <10 ug/mL — ABNORMAL LOW (ref 50–100)

## 2024-08-11 LAB — MAGNESIUM: Magnesium: 2.2 mg/dL (ref 1.7–2.4)

## 2024-08-11 LAB — ETHANOL: Alcohol, Ethyl (B): <15 mg/dL (ref <15)

## 2024-08-11 LAB — CBG MONITORING, ED: Glucose-Capillary: 95 mg/dL (ref 70–99)

## 2024-08-11 MED ORDER — DEXTROSE 5 % IV SOLN
1500.0000 mg | Freq: Once | INTRAVENOUS | Status: AC
  Administered 2024-08-11: 1500 mg via INTRAVENOUS
  Filled 2024-08-11: qty 15

## 2024-08-11 MED ORDER — SODIUM CHLORIDE 0.9 % IV BOLUS
1000.0000 mL | Freq: Once | INTRAVENOUS | Status: AC
Start: 2024-08-11 — End: 2024-08-12
  Administered 2024-08-11: 1000 mL via INTRAVENOUS
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
## 2024-08-11 NOTE — Discharge Instructions (Addendum)
 Take your medications as directed by your doctor!  No driving until cleared by neurology.

## 2024-08-29 ENCOUNTER — Emergency Department (HOSPITAL_COMMUNITY)

## 2024-08-29 ENCOUNTER — Other Ambulatory Visit (HOSPITAL_COMMUNITY): Payer: Self-pay | Admitting: Radiology

## 2024-08-29 ENCOUNTER — Emergency Department (HOSPITAL_COMMUNITY)
Admission: EM | Admit: 2024-08-29 | Discharge: 2024-08-29 | Disposition: A | Discharge status: Home / Self Care | Attending: Emergency Medicine | Admitting: Emergency Medicine

## 2024-08-29 DIAGNOSIS — M545 Low back pain, unspecified: Secondary | ICD-10-CM | POA: Diagnosis present

## 2024-08-29 DIAGNOSIS — Z7901 Long term (current) use of anticoagulants: Secondary | ICD-10-CM | POA: Diagnosis not present

## 2024-08-29 LAB — CBC WITH DIFFERENTIAL/PLATELET
Abs Immature Granulocytes: 0.03 K/uL (ref 0.00–0.07)
Basophils Absolute: 0 K/uL (ref 0.0–0.1)
Basophils Relative: 1 %
Eosinophils Absolute: 0 K/uL (ref 0.0–0.5)
Eosinophils Relative: 0 %
HCT: 52.5 % — ABNORMAL HIGH (ref 39.0–52.0)
Hemoglobin: 16.5 g/dL (ref 13.0–17.0)
Immature Granulocytes: 1 %
Lymphocytes Relative: 29 %
Lymphs Abs: 1.8 K/uL (ref 0.7–4.0)
MCH: 26.5 pg (ref 26.0–34.0)
MCHC: 31.4 g/dL (ref 30.0–36.0)
MCV: 84.3 fL (ref 80.0–100.0)
Monocytes Absolute: 0.4 K/uL (ref 0.1–1.0)
Monocytes Relative: 7 %
Neutro Abs: 4 K/uL (ref 1.7–7.7)
Neutrophils Relative %: 62 %
Platelets: 275 K/uL (ref 150–400)
RBC: 6.23 MIL/uL — ABNORMAL HIGH (ref 4.22–5.81)
RDW: 13.5 % (ref 11.5–15.5)
WBC: 6.3 K/uL (ref 4.0–10.5)
nRBC: 0 % (ref 0.0–0.2)

## 2024-08-29 LAB — URINALYSIS, W/ REFLEX TO CULTURE (INFECTION SUSPECTED)
Bacteria, UA: NONE SEEN
Bilirubin Urine: NEGATIVE
Glucose, UA: NEGATIVE mg/dL
Hgb urine dipstick: NEGATIVE
Ketones, ur: NEGATIVE mg/dL
Leukocytes,Ua: NEGATIVE
Nitrite: NEGATIVE
Protein, ur: NEGATIVE mg/dL
Specific Gravity, Urine: 1.026 (ref 1.005–1.030)
pH: 5 (ref 5.0–8.0)

## 2024-08-29 LAB — BASIC METABOLIC PANEL WITH GFR
Anion gap: 11 (ref 5–15)
BUN: 8 mg/dL (ref 6–20)
CO2: 21 mmol/L — ABNORMAL LOW (ref 22–32)
Calcium: 9.2 mg/dL (ref 8.9–10.3)
Chloride: 106 mmol/L (ref 98–111)
Creatinine, Ser: 1.28 mg/dL — ABNORMAL HIGH (ref 0.61–1.24)
GFR, Estimated: >60 mL/min (ref >60)
Glucose, Bld: 102 mg/dL — ABNORMAL HIGH (ref 70–99)
Potassium: 4 mmol/L (ref 3.5–5.1)
Sodium: 138 mmol/L (ref 135–145)

## 2024-08-29 MED ORDER — ONDANSETRON HCL 4 MG/2ML IJ SOLN
4.0000 mg | Freq: Once | INTRAMUSCULAR | Status: AC
Start: 2024-08-29 — End: 2024-08-29
  Administered 2024-08-29: 4 mg via INTRAVENOUS
  Filled 2024-08-29: qty 2

## 2024-08-29 MED ORDER — MORPHINE SULFATE (PF) 4 MG/ML IV SOLN
4.0000 mg | Freq: Once | INTRAVENOUS | Status: AC
  Administered 2024-08-29: 4 mg via INTRAVENOUS
  Filled 2024-08-29: qty 1

## 2024-08-29 MED ORDER — OXYCODONE HCL 5 MG PO TABS
5.0000 mg | ORAL_TABLET | ORAL | Status: AC
  Administered 2024-08-29: 5 mg via ORAL
  Filled 2024-08-29: qty 1

## 2024-08-29 MED ORDER — CYCLOBENZAPRINE HCL 10 MG PO TABS
10.0000 mg | ORAL_TABLET | Freq: Two times a day (BID) | ORAL | 0 refills | Status: AC | PRN
  Filled 2024-08-29: qty 20, 10d supply, fill #0

## 2024-08-29 MED ORDER — LIDOCAINE 5 % EX PTCH
1.0000 | MEDICATED_PATCH | CUTANEOUS | Status: DC
  Administered 2024-08-29: 1 via TRANSDERMAL
  Filled 2024-08-29: qty 1

## 2024-08-29 MED ORDER — DEXAMETHASONE SODIUM PHOSPHATE 10 MG/ML IJ SOLN
8.0000 mg | Freq: Once | INTRAMUSCULAR | Status: AC
  Administered 2024-08-29: 8 mg via INTRAVENOUS
  Filled 2024-08-29: qty 1

## 2024-08-29 MED ORDER — METHYLPREDNISOLONE 4 MG PO TBPK
ORAL_TABLET | ORAL | 0 refills | Status: AC
  Filled 2024-08-29: qty 21, 6d supply, fill #0

## 2024-08-29 NOTE — ED Provider Notes (Signed)
 [place] EMERGENCY DEPARTMENT AT [facility] Provider Note   CSN: [id number] Arrival date & time: 08/29/24  1222     Patient presents with: Back Pain   John Flynn is a 38 y.o. male.  {Add pertinent medical, surgical, social history, OB history to HPI:9443} 38 year old male with prior medical history as detailed below presents for evaluation.  Patient presents from his halfway house.  He complains of significant low back pain x 3 days.  He reports significant pain with any movement.  He denies numbness or weakness in his lower extremities.  He denies chest pain or shortness of breath.  He denies any specific inciting injury or event.  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  He denies any missed doses of Xarelto .  He denies difficulty urinating.  His last urination was earlier today.  He denies difficulty with bowel movement his last bowel movement was yesterday.  He is able to walk - however, he reports significant pain and discomfort with attempted ambulation.  The history is provided by the patient and medical records.       Prior to Admission medications   Medication Sig Start Date End Date Taking? Authorizing Provider  albuterol  (VENTOLIN  HFA) 108 (90 Base) MCG/ACT inhaler Inhale 2 puffs into the lungs 2 (two) times daily as needed for wheezing or shortness of breath.    [provider]  alum & mag hydroxide-simeth (MAALOX/MYLANTA) 200-200-20 MG/5ML suspension Take 30 mLs by mouth 3 (three) times daily.    [provider]  cetirizine (ZYRTEC) 10 MG tablet Take 10 mg by mouth daily as needed for allergies.    [provider]  ciclesonide  (ALVESCO ) 160 MCG/ACT inhaler Inhale 2 puffs into the lungs 2 (two) times daily.    [provider]  coal tar (NEUTROGENA T-GEL) 0.5 % shampoo Apply 1 Application topically daily as needed (itching).    [provider]  lacosamide  (VIMPAT )  200 MG TABS tablet Take 200 mg by mouth 2 (two) times daily. 12/15/21   [provider]  levETIRAcetam  (KEPPRA ) 500 MG tablet Take 500 mg by mouth daily.    [provider]  mirtazapine  (REMERON ) 15 MG tablet Take 1 tablet (15 mg total) by mouth at bedtime. 05/27/22 07/22/22  Lynnette Barter, MD  prazosin  (MINIPRESS ) 1 MG capsule Take 1 capsule (1 mg total) by mouth at bedtime for 7 days, THEN 2 capsules (2 mg total) at bedtime. 07/24/22 08/30/22  Espinoza, Alejandra, DO  rivaroxaban  (XARELTO ) 20 MG TABS tablet Take 20 mg by mouth daily with supper.    [provider]  sodium chloride  (OCEAN) 0.65 % nasal spray Place 2 sprays into the nose 2 (two) times daily.    [provider]  phenytoin  (DILANTIN ) 300 MG ER capsule Take 300 mg by mouth 2 (two) times daily.  11/21/20  [provider]    Allergies: Egg-derived products, Influenza virus vaccine, Aspirin, and Nsaids    Review of Systems  All other systems reviewed and are negative.   Updated Vital Signs There were no vitals taken for this visit.  Physical Exam Vitals and nursing note reviewed.  Constitutional:      General: He is not in acute distress.    Appearance: Normal appearance. He is well-developed.  HENT:     Head: Normocephalic and atraumatic.  Eyes:     Conjunctiva/sclera: Conjunctivae normal.     Pupils: Pupils are equal, round, and reactive to light.  Cardiovascular:     Rate and Rhythm: Normal rate and regular rhythm.     Heart sounds: Normal heart sounds.  Pulmonary:     Effort: Pulmonary effort is normal. No respiratory distress.     Breath sounds: Normal breath sounds.  Abdominal:     General: There is no distension.     Palpations: Abdomen is soft.     Tenderness: There is no abdominal tenderness.  Musculoskeletal:        General: Tenderness present. No deformity. Normal range of motion.     Cervical back: Normal range of motion and neck supple.     Comments: Patient  localizes tenderness to the mid lumbar spine.  No appreciable erythema or edema overlying this area of tenderness.  No rash.  Distal bilateral lower extremities are neurovascular intact.  Skin:    General: Skin is warm and dry.  Neurological:     General: No focal deficit present.     Mental Status: He is alert and oriented to person, place, and time. Mental status is at baseline.     (all labs ordered are listed, but only abnormal results are displayed) Labs Reviewed  CBC WITH DIFFERENTIAL/PLATELET  BASIC METABOLIC PANEL WITH GFR  URINALYSIS, W/ REFLEX TO CULTURE (INFECTION SUSPECTED)    EKG: None  Radiology: No results found.  {Document cardiac monitor, telemetry assessment procedure when appropriate:32947} Procedures   Medications Ordered in the ED  morphine (PF) 4 MG/ML injection 4 mg (has no administration in time range)      {Click here for ABCD2, HEART and other calculators REFRESH Note before signing:1}                              Medical Decision Making Amount and/or Complexity of Data Reviewed Labs: ordered. Radiology: ordered.  Risk Prescription drug management.   ***  {Document critical care time when appropriate  Document review of labs and clinical decision tools ie CHADS2VASC2, etc  Document your independent review of radiology images and any outside records  Document your discussion with family members, caretakers and with consultants  Document social determinants of health affecting pt's care  Document your decision making why or why not admission, treatments were needed:32947:::1}   Final diagnoses:  None    ED Discharge Orders     None

## 2024-08-29 NOTE — ED Triage Notes (Addendum)
 Pt BIb [facility] from a halfway house for severe lower back pain x 3 days. Denies any recent injuries.  Pt works Aeronautical engineer.  Difficult to find comfy position.  Good PMS in extremities.  Hx of seizures and blood clots and CKD.  Mild pitting edema in lower extremities.  Pt is on xarelto .   140/98 HR 80 97%

## 2024-08-29 NOTE — Discharge Instructions (Signed)
 You were seen for your back pain in the emergency department.   At home, please use over-the-counter Tylenol , ibuprofen, and lidocaine  patches.  You may also use the cyclobenzaprine we have prescribed you as needed for pain.  Do not take the cyclobenzaprine before driving or operating heavy machinery.  Take the steroid Dosepak to decrease the inflammation  Follow-up with your primary doctor in 2-3 days regarding your visit.  Follow-up with the spine clinic as soon as possible regarding your symptoms.  Return immediately to the emergency department if you experience any of the following: Numbness or weakness of your legs, bowel or bladder incontinence, numbness while wiping after pooping or urinating, or any other concerning symptoms.    Thank you for visiting our Emergency Department. It was a pleasure taking care of you today.

## 2024-08-29 NOTE — ED Provider Notes (Signed)
  Physical Exam  BP (!) 152/100 (BP Location: Right Arm)   Pulse 66   Temp 98.2 F (36.8 C) (Oral)   Resp 17   SpO2 94%   Physical Exam  Procedures  Procedures  ED Course / MDM   Clinical Course as of 08/30/24 0023  Sun Aug 29, 2024  1522 Assumed care from Dr Laurice. 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 No trauma or injuries. Has non-focal neurologic exam. CT L spine without abnormality. Getting MRI to rule out epidural hematoma or cauda equina. [RP]  2049 Patient reassessed.  Back pain has improved but would like another dose of pain medication.  No leg weakness or numbness.  MRI with mild degenerative changes but no signs of cauda equina or severe spinal stenosis.  No additional acute findings on MRI.  Will give him pain medication, Medrol Dosepak, and have him follow-up with spine [RP]    Clinical Course User Index [RP] Yolande Lamar BROCKS, MD   Medical Decision Making Amount and/or Complexity of Data Reviewed Labs: ordered. Radiology: ordered.  Risk Prescription drug management.        Yolande Lamar BROCKS, MD 08/30/24 [phone]

## 2024-08-30 ENCOUNTER — Other Ambulatory Visit: Payer: Self-pay

## 2024-08-31 ENCOUNTER — Other Ambulatory Visit: Payer: Self-pay

## 2024-09-01 ENCOUNTER — Ambulatory Visit: Admitting: Neurology

## 2024-09-01 ENCOUNTER — Encounter: Payer: Self-pay | Admitting: Neurology

## 2024-09-02 ENCOUNTER — Encounter (HOSPITAL_COMMUNITY): Payer: Self-pay

## 2024-09-02 ENCOUNTER — Emergency Department (HOSPITAL_COMMUNITY)
Admission: EM | Admit: 2024-09-02 | Discharge: 2024-09-02 | Disposition: A | Discharge status: Home / Self Care | Payer: PRIVATE HEALTH INSURANCE | Attending: Emergency Medicine | Admitting: Emergency Medicine

## 2024-09-02 ENCOUNTER — Other Ambulatory Visit: Payer: Self-pay

## 2024-09-02 DIAGNOSIS — Z7901 Long term (current) use of anticoagulants: Secondary | ICD-10-CM | POA: Insufficient documentation

## 2024-09-02 DIAGNOSIS — M5416 Radiculopathy, lumbar region: Secondary | ICD-10-CM | POA: Insufficient documentation

## 2024-09-02 DIAGNOSIS — M545 Low back pain, unspecified: Secondary | ICD-10-CM | POA: Diagnosis present

## 2024-09-02 MED ORDER — DIAZEPAM 5 MG PO TABS
5.0000 mg | ORAL_TABLET | Freq: Once | ORAL | Status: AC
  Administered 2024-09-02: 5 mg via ORAL
  Filled 2024-09-02: qty 1

## 2024-09-02 MED ORDER — HYDROMORPHONE HCL 1 MG/ML IJ SOLN
1.0000 mg | Freq: Once | INTRAMUSCULAR | Status: AC
  Administered 2024-09-02: 1 mg via INTRAMUSCULAR
  Filled 2024-09-02: qty 1

## 2024-09-02 MED ORDER — OXYCODONE-ACETAMINOPHEN 5-325 MG PO TABS: 1.0000 | ORAL_TABLET | Freq: Four times a day (QID) | ORAL | 0 refills | Status: AC | PRN

## 2024-09-02 MED ORDER — DIAZEPAM 5 MG PO TABS: 5.0000 mg | ORAL_TABLET | Freq: Three times a day (TID) | ORAL | 0 refills | Status: AC | PRN

## 2024-09-02 NOTE — ED Provider Notes (Signed)
 [place] EMERGENCY DEPARTMENT AT [facility] Provider Note   CSN: [id number] Arrival date & time: 09/02/24  2058     Patient presents with: Back Pain   John Flynn is a 38 y.o. male history of seizure, PE on Xarelto , here presenting with back pain.  Patient was seen here recently for back pain.  Patient had an MRI that showed degenerative changes at L2-3 and L3-4 and L5-S1.  Patient was prescribed steroids and Robaxin.  He states that has not been helping to control his pain.  He states that he is under federal custody and he does not know when he will be able to get into see a spine surgeon.  Patient states that he has worsening pain especially on the left side.  He denies any incontinence   The history is provided by the patient.       Prior to Admission medications   Medication Sig Start Date End Date Taking? Authorizing Provider  albuterol  (VENTOLIN  HFA) 108 (90 Base) MCG/ACT inhaler Inhale 2 puffs into the lungs 2 (two) times daily as needed for wheezing or shortness of breath.    [provider]  alum & mag hydroxide-simeth (MAALOX/MYLANTA) 200-200-20 MG/5ML suspension Take 30 mLs by mouth 3 (three) times daily.    [provider]  cetirizine (ZYRTEC) 10 MG tablet Take 10 mg by mouth daily as needed for allergies.    [provider]  ciclesonide  (ALVESCO ) 160 MCG/ACT inhaler Inhale 2 puffs into the lungs 2 (two) times daily.    [provider]  coal tar (NEUTROGENA T-GEL) 0.5 % shampoo Apply 1 Application topically daily as needed (itching).    [provider]  cyclobenzaprine (FLEXERIL) 10 MG tablet Take 1 tablet (10 mg total) by mouth 2 (two) times daily as needed for muscle spasms. 08/29/24   Yolande Lamar BROCKS, MD  lacosamide  (VIMPAT ) 200 MG TABS tablet Take 200 mg by mouth 2 (two) times daily. 12/15/21   [provider]  levETIRAcetam  (KEPPRA ) 500 MG tablet Take 500 mg by mouth daily.    [provider]  methylPREDNISolone (MEDROL DOSEPAK) 4 MG TBPK tablet Take as directed on the package 08/29/24   Yolande Lamar BROCKS, MD  mirtazapine  (REMERON ) 15 MG tablet Take 1 tablet (15 mg total) by mouth at bedtime. 05/27/22 07/22/22  Lynnette Barter, MD  prazosin  (MINIPRESS ) 1 MG capsule Take 1 capsule (1 mg total) by mouth at bedtime for 7 days, THEN 2 capsules (2 mg total) at bedtime. 07/24/22 08/30/22  Espinoza, Alejandra, DO  rivaroxaban  (XARELTO ) 20 MG TABS tablet Take 20 mg by mouth daily with supper.    [provider]  sodium chloride  (OCEAN) 0.65 % nasal spray Place 2 sprays into the nose 2 (two) times daily.    [provider]  phenytoin  (DILANTIN ) 300 MG ER capsule Take 300 mg by mouth 2 (two) times daily.  11/21/20  [provider]    Allergies: Egg-derived products, Influenza virus vaccine, Aspirin, and Nsaids    Review of Systems  Musculoskeletal:  Positive for back pain.  All other systems reviewed and are negative.   Updated Vital Signs BP (!) 141/98 (BP Location: Right Arm)   Pulse 80   Temp 98.6 F (37 C) (Oral)   Resp 16   Ht 5' 9.5 (1.765 m)   Wt 99.8 kg   SpO2 95%   BMI 32.02 kg/m   Physical Exam Vitals and nursing note reviewed.  Constitutional:  Comments: Uncomfortable  HENT:     Head: Normocephalic.     Mouth/Throat:     Mouth: Mucous membranes are moist.  Eyes:     Extraocular Movements: Extraocular movements intact.     Pupils: Pupils are equal, round, and reactive to light.  Cardiovascular:     Rate and Rhythm: Normal rate.     Pulses: Normal pulses.  Pulmonary:     Effort: Pulmonary effort is normal.  Abdominal:     General: Abdomen is flat.     Palpations: Abdomen is soft.  Musculoskeletal:     Cervical back: Normal range of motion.     Comments: Left paralumbar tenderness  Skin:    General: Skin is warm.     Capillary Refill: Capillary refill takes less than 2 seconds.  Neurological:     General: No focal deficit  present.     Mental Status: He is alert.     Comments: Patient has positive straight leg raise on the left side.  Patient has normal sensation bilateral legs.  Patient has normal reflexes bilateral knees.  No saddle anesthesia  Psychiatric:        Mood and Affect: Mood normal.        Behavior: Behavior normal.     (all labs ordered are listed, but only abnormal results are displayed) Labs Reviewed - No data to display  EKG: None  Radiology: No results found.   Procedures   Medications Ordered in the ED  HYDROmorphone (DILAUDID) injection 1 mg (has no administration in time range)  diazepam (VALIUM) tablet 5 mg (has no administration in time range)                                    Medical Decision Making John Flynn is a 38 y.o. male here presenting with radiculopathy.  I reviewed the recent MRI and patient has multilevel disease and no spinal compression at that time.  Patient has no saddle anesthesia and no incontinence.  Patient has tried prednisone and Robaxin with no relief.  I do not think he needs a repeat MRI at this point.  Will add Percocet as needed for pain and try Valium for muscle spasm.   Problems Addressed: Lumbar radiculopathy: acute illness or injury  Risk Prescription drug management.     Final diagnoses:  None    ED Discharge Orders     None          Patt Alm Macho, MD 09/02/24 2131

## 2024-09-02 NOTE — Discharge Instructions (Addendum)
 As we discussed, you have pinched nerve in your back that is causing your symptoms  Please finish your steroids as prescribed  I have prescribed Percocet 1 tablet every 6 hours as needed for pain  I have also added Valium 5 mg every 8 hours as needed  Please call [facility] for follow-up  Return to ER if you have severe pain or weakness or numbness

## 2024-09-02 NOTE — ED Triage Notes (Addendum)
 Pt bib EMS from home for lower lumbar pain radiating down left leg. Seen 1 week ago for same and told he has 7 pinched nerves. Taken prednisone and methocarbamol without and help. Pain gradually worsening with decreased mobility.  No loss of bowel/bladder. VSS.
